# Patient Record
Sex: Male | Born: 1985 | Race: White | Hispanic: No | Marital: Single | Smoking: Never smoker
Health system: Southern US, Community
[De-identification: ages and names within clinical notes are randomized; demographics above are authoritative.]

## PROBLEM LIST (undated history)

## (undated) DIAGNOSIS — F419 Anxiety disorder, unspecified: Secondary | ICD-10-CM

## (undated) HISTORY — PX: KNEE SURGERY: SHX244

## (undated) HISTORY — PX: SHOULDER SURGERY: SHX246

---

## 2013-02-16 DIAGNOSIS — N451 Epididymitis: Secondary | ICD-10-CM | POA: Insufficient documentation

## 2014-03-01 ENCOUNTER — Emergency Department (HOSPITAL_COMMUNITY)
Admission: EM | Admit: 2014-03-01 | Discharge: 2014-03-01 | Disposition: A | Discharge status: Home / Self Care | Payer: No Typology Code available for payment source | Source: Home / Self Care | Attending: Family Medicine | Admitting: Family Medicine

## 2014-03-01 ENCOUNTER — Encounter (HOSPITAL_COMMUNITY): Payer: Self-pay | Admitting: Emergency Medicine

## 2014-03-01 DIAGNOSIS — B354 Tinea corporis: Secondary | ICD-10-CM

## 2014-03-01 MED ORDER — TERBINAFINE HCL 250 MG PO TABS: 250.0000 mg | ORAL_TABLET | Freq: Every day | ORAL | Status: DC

## 2014-03-01 NOTE — ED Provider Notes (Signed)
CSN: [id number]     Arrival date & time 03/01/14  1326 History   First MD Initiated Contact with Patient 03/01/14 1428     Chief Complaint  Patient presents with  . Recurrent Skin Infections   (Consider location/radiation/quality/duration/timing/severity/associated sxs/prior Treatment) HPI Comments: Patient reports that he developed a ringworm lesion at his right forearm about a month ago and developed a similar lesion at left forearm about two weeks ago. Has been using topical Tinactin with little improvement. No fever, chills , malaise, tick bites. Reports himself to be otherwise healthy.  PCP: In [place], [place] Works as IT sales professional.   The history is provided by the patient.    History reviewed. No pertinent past medical history. Past Surgical History  Procedure Laterality Date  . Shoulder surgery    . Knee surgery     No family history on file. History  Substance Use Topics  . Smoking status: Never Smoker   . Smokeless tobacco: Not on file  . Alcohol Use: Yes    Review of Systems  All other systems reviewed and are negative.   Allergies  Penicillins  Home Medications   Prior to Admission medications   Medication Sig Start Date End Date Taking? Authorizing Provider  terbinafine (LAMISIL) 250 MG tablet Take 1 tablet (250 mg total) by mouth daily. X 14 days 03/01/14   Annett Gula H Irem Stoneham, PA   BP 105/71  Pulse 84  Temp(Src) 98.9 F (37.2 C) (Oral)  Resp 16  SpO2 98% Physical Exam  Nursing note and vitals reviewed. Constitutional: He is oriented to person, place, and time. He appears well-developed and well-nourished. No distress.  HENT:  Head: Normocephalic and atraumatic.  Eyes: Conjunctivae are normal.  Cardiovascular: Normal rate.   Pulmonary/Chest: Effort normal.  Musculoskeletal: Normal range of motion.  Neurological: He is alert and oriented to person, place, and time.  Skin: Skin is warm and dry. Rash noted.     Psychiatric: He has a  normal mood and affect. His behavior is normal.    ED Course  Procedures (including critical care time) Labs Review Labs Reviewed - No data to display  Imaging Review No results found.   MDM   1. Ringworm of body    Will prescribe Lamisil 250 po QD x 14 days and advise follow up if no improvement.     Lutricia Feil, [place] 03/01/14 [phone]

## 2014-03-01 NOTE — Discharge Instructions (Signed)

## 2014-03-01 NOTE — ED Notes (Signed)
Bilateral ringworm on both forearms.  Reports he normally can get rid of this with home remedies.  This time not able to do so

## 2014-03-02 NOTE — ED Provider Notes (Signed)
Medical screening examination/treatment/procedure(s) were performed by resident physician or non-physician practitioner and as supervising physician I was immediately available for consultation/collaboration.   Pauline Good MD.   Billy Fischer, MD 03/02/14 [phone]

## 2014-04-15 DIAGNOSIS — S62305A Unspecified fracture of fourth metacarpal bone, left hand, initial encounter for closed fracture: Secondary | ICD-10-CM | POA: Insufficient documentation

## 2016-12-19 DIAGNOSIS — G8929 Other chronic pain: Secondary | ICD-10-CM | POA: Insufficient documentation

## 2016-12-19 DIAGNOSIS — L989 Disorder of the skin and subcutaneous tissue, unspecified: Secondary | ICD-10-CM | POA: Insufficient documentation

## 2016-12-19 DIAGNOSIS — D229 Melanocytic nevi, unspecified: Secondary | ICD-10-CM | POA: Insufficient documentation

## 2016-12-19 DIAGNOSIS — D239 Other benign neoplasm of skin, unspecified: Secondary | ICD-10-CM | POA: Insufficient documentation

## 2019-10-01 ENCOUNTER — Other Ambulatory Visit (HOSPITAL_COMMUNITY): Payer: Self-pay | Admitting: Orthopedic Surgery

## 2019-10-04 ENCOUNTER — Other Ambulatory Visit: Payer: Self-pay

## 2019-10-04 ENCOUNTER — Encounter (HOSPITAL_BASED_OUTPATIENT_CLINIC_OR_DEPARTMENT_OTHER): Payer: Self-pay | Admitting: Orthopedic Surgery

## 2019-10-04 ENCOUNTER — Other Ambulatory Visit (HOSPITAL_COMMUNITY)
Admission: RE | Admit: 2019-10-04 | Discharge: 2019-10-04 | Disposition: A | Discharge status: Home / Self Care | Payer: 59 | Source: Ambulatory Visit | Attending: Orthopedic Surgery | Admitting: Orthopedic Surgery

## 2019-10-04 DIAGNOSIS — Z20822 Contact with and (suspected) exposure to covid-19: Secondary | ICD-10-CM | POA: Insufficient documentation

## 2019-10-04 DIAGNOSIS — Z01812 Encounter for preprocedural laboratory examination: Secondary | ICD-10-CM | POA: Insufficient documentation

## 2019-10-04 LAB — SARS CORONAVIRUS 2 (TAT 6-24 HRS): SARS Coronavirus 2: NEGATIVE

## 2019-10-05 ENCOUNTER — Ambulatory Visit (HOSPITAL_BASED_OUTPATIENT_CLINIC_OR_DEPARTMENT_OTHER)
Admission: RE | Admit: 2019-10-05 | Discharge: 2019-10-05 | Disposition: A | Discharge status: Home / Self Care | Payer: 59 | Attending: Orthopedic Surgery | Admitting: Orthopedic Surgery

## 2019-10-05 ENCOUNTER — Encounter (HOSPITAL_BASED_OUTPATIENT_CLINIC_OR_DEPARTMENT_OTHER)
Admission: RE | Disposition: A | Discharge status: Home / Self Care | Payer: Self-pay | Source: Home / Self Care | Attending: Orthopedic Surgery

## 2019-10-05 ENCOUNTER — Ambulatory Visit (HOSPITAL_COMMUNITY): Payer: 59

## 2019-10-05 ENCOUNTER — Encounter (HOSPITAL_BASED_OUTPATIENT_CLINIC_OR_DEPARTMENT_OTHER): Payer: Self-pay | Admitting: Orthopedic Surgery

## 2019-10-05 ENCOUNTER — Ambulatory Visit (HOSPITAL_BASED_OUTPATIENT_CLINIC_OR_DEPARTMENT_OTHER): Payer: 59 | Admitting: Certified Registered"

## 2019-10-05 ENCOUNTER — Other Ambulatory Visit: Payer: Self-pay

## 2019-10-05 DIAGNOSIS — S82231D Displaced oblique fracture of shaft of right tibia, subsequent encounter for closed fracture with routine healing: Secondary | ICD-10-CM

## 2019-10-05 DIAGNOSIS — S82201A Unspecified fracture of shaft of right tibia, initial encounter for closed fracture: Secondary | ICD-10-CM | POA: Insufficient documentation

## 2019-10-05 DIAGNOSIS — S82891A Other fracture of right lower leg, initial encounter for closed fracture: Secondary | ICD-10-CM | POA: Diagnosis not present

## 2019-10-05 DIAGNOSIS — Y92410 Unspecified street and highway as the place of occurrence of the external cause: Secondary | ICD-10-CM | POA: Insufficient documentation

## 2019-10-05 DIAGNOSIS — Z419 Encounter for procedure for purposes other than remedying health state, unspecified: Secondary | ICD-10-CM

## 2019-10-05 HISTORY — PX: ORIF ANKLE FRACTURE: SHX5408

## 2019-10-05 HISTORY — DX: Anxiety disorder, unspecified: F41.9

## 2019-10-05 HISTORY — PX: TIBIA IM NAIL INSERTION: SHX2516

## 2019-10-05 SURGERY — INSERTION, INTRAMEDULLARY ROD, TIBIA
Anesthesia: General | Site: Leg Lower | Laterality: Right

## 2019-10-05 MED ORDER — OXYCODONE HCL 5 MG PO TABS
ORAL_TABLET | ORAL | Status: AC
  Filled 2019-10-05: qty 2

## 2019-10-05 MED ORDER — ONDANSETRON HCL 4 MG/2ML IJ SOLN
INTRAMUSCULAR | Status: DC | PRN
  Administered 2019-10-05: 4 mg via INTRAVENOUS

## 2019-10-05 MED ORDER — FENTANYL CITRATE (PF) 100 MCG/2ML IJ SOLN
INTRAMUSCULAR | Status: AC
  Filled 2019-10-05: qty 2

## 2019-10-05 MED ORDER — FENTANYL CITRATE (PF) 100 MCG/2ML IJ SOLN
50.0000 ug | INTRAMUSCULAR | Status: DC | PRN
  Administered 2019-10-05: 100 ug via INTRAVENOUS

## 2019-10-05 MED ORDER — OXYCODONE HCL 5 MG/5ML PO SOLN: 5.0000 mg | Freq: Once | ORAL | Status: DC | PRN

## 2019-10-05 MED ORDER — MIDAZOLAM HCL 2 MG/2ML IJ SOLN
INTRAMUSCULAR | Status: AC
  Filled 2019-10-05: qty 2

## 2019-10-05 MED ORDER — DEXAMETHASONE SODIUM PHOSPHATE 10 MG/ML IJ SOLN
INTRAMUSCULAR | Status: DC | PRN
  Administered 2019-10-05: 4 mg via INTRAVENOUS

## 2019-10-05 MED ORDER — KETOROLAC TROMETHAMINE 30 MG/ML IJ SOLN
30.0000 mg | Freq: Once | INTRAMUSCULAR | Status: AC
  Administered 2019-10-05: 30 mg via INTRAVENOUS

## 2019-10-05 MED ORDER — SENNA 8.6 MG PO TABS: 2.0000 | ORAL_TABLET | Freq: Two times a day (BID) | ORAL | 0 refills | Status: DC

## 2019-10-05 MED ORDER — LIDOCAINE HCL (CARDIAC) PF 100 MG/5ML IV SOSY
PREFILLED_SYRINGE | INTRAVENOUS | Status: DC | PRN
  Administered 2019-10-05: 80 mg via INTRAVENOUS

## 2019-10-05 MED ORDER — ACETAMINOPHEN 500 MG PO TABS
1000.0000 mg | ORAL_TABLET | Freq: Once | ORAL | Status: AC
  Administered 2019-10-05: 1000 mg via ORAL

## 2019-10-05 MED ORDER — MIDAZOLAM HCL 2 MG/2ML IJ SOLN
1.0000 mg | INTRAMUSCULAR | Status: DC | PRN
  Administered 2019-10-05: 1 mg via INTRAVENOUS

## 2019-10-05 MED ORDER — OXYCODONE HCL 5 MG PO TABS: 5.0000 mg | ORAL_TABLET | Freq: Once | ORAL | Status: DC | PRN

## 2019-10-05 MED ORDER — FENTANYL CITRATE (PF) 100 MCG/2ML IJ SOLN
INTRAMUSCULAR | Status: DC | PRN
  Administered 2019-10-05: 50 ug via INTRAVENOUS
  Administered 2019-10-05 (×2): 25 ug via INTRAVENOUS

## 2019-10-05 MED ORDER — VANCOMYCIN HCL 500 MG IV SOLR
INTRAVENOUS | Status: DC | PRN
  Administered 2019-10-05: 500 mg via TOPICAL

## 2019-10-05 MED ORDER — ACETAMINOPHEN 500 MG PO TABS
ORAL_TABLET | ORAL | Status: AC
  Filled 2019-10-05: qty 2

## 2019-10-05 MED ORDER — PROPOFOL 10 MG/ML IV BOLUS
INTRAVENOUS | Status: AC
  Filled 2019-10-05: qty 20

## 2019-10-05 MED ORDER — 0.9 % SODIUM CHLORIDE (POUR BTL) OPTIME
TOPICAL | Status: DC | PRN
  Administered 2019-10-05: 1000 mL

## 2019-10-05 MED ORDER — LACTATED RINGERS IV SOLN: INTRAVENOUS | Status: DC

## 2019-10-05 MED ORDER — BUPIVACAINE LIPOSOME 1.3 % IJ SUSP
INTRAMUSCULAR | Status: DC | PRN
  Administered 2019-10-05 (×2): 5 mL via PERINEURAL

## 2019-10-05 MED ORDER — OXYCODONE HCL 5 MG/5ML PO SOLN: 5.0000 mg | Freq: Once | ORAL | Status: AC | PRN

## 2019-10-05 MED ORDER — PHENYLEPHRINE 40 MCG/ML (10ML) SYRINGE FOR IV PUSH (FOR BLOOD PRESSURE SUPPORT)
PREFILLED_SYRINGE | INTRAVENOUS | Status: AC
  Filled 2019-10-05: qty 20

## 2019-10-05 MED ORDER — EPHEDRINE 5 MG/ML INJ
INTRAVENOUS | Status: AC
  Filled 2019-10-05: qty 20

## 2019-10-05 MED ORDER — LIDOCAINE 2% (20 MG/ML) 5 ML SYRINGE
INTRAMUSCULAR | Status: AC
  Filled 2019-10-05: qty 5

## 2019-10-05 MED ORDER — OXYCODONE HCL 5 MG PO TABS: 5.0000 mg | ORAL_TABLET | ORAL | 0 refills | Status: AC | PRN

## 2019-10-05 MED ORDER — PHENYLEPHRINE HCL (PRESSORS) 10 MG/ML IV SOLN
INTRAVENOUS | Status: DC | PRN
  Administered 2019-10-05 (×2): 80 ug via INTRAVENOUS

## 2019-10-05 MED ORDER — BUPIVACAINE HCL (PF) 0.5 % IJ SOLN
INTRAMUSCULAR | Status: AC
  Filled 2019-10-05: qty 30

## 2019-10-05 MED ORDER — CEFAZOLIN SODIUM-DEXTROSE 2-4 GM/100ML-% IV SOLN
INTRAVENOUS | Status: AC
  Filled 2019-10-05: qty 100

## 2019-10-05 MED ORDER — PROPOFOL 10 MG/ML IV BOLUS
INTRAVENOUS | Status: DC | PRN
  Administered 2019-10-05: 50 mg via INTRAVENOUS
  Administered 2019-10-05: 150 mg via INTRAVENOUS

## 2019-10-05 MED ORDER — BUPIVACAINE-EPINEPHRINE (PF) 0.5% -1:200000 IJ SOLN
INTRAMUSCULAR | Status: DC | PRN
  Administered 2019-10-05: 7 mL via PERINEURAL
  Administered 2019-10-05: 15 mL via PERINEURAL

## 2019-10-05 MED ORDER — VANCOMYCIN HCL 500 MG IV SOLR
INTRAVENOUS | Status: AC
  Filled 2019-10-05: qty 500

## 2019-10-05 MED ORDER — DOCUSATE SODIUM 100 MG PO CAPS: 100.0000 mg | ORAL_CAPSULE | Freq: Every day | ORAL | 2 refills | Status: DC | PRN

## 2019-10-05 MED ORDER — PROMETHAZINE HCL 25 MG/ML IJ SOLN: 6.2500 mg | INTRAMUSCULAR | Status: DC | PRN

## 2019-10-05 MED ORDER — CEFAZOLIN SODIUM-DEXTROSE 2-4 GM/100ML-% IV SOLN
2.0000 g | INTRAVENOUS | Status: AC
  Administered 2019-10-05: 15:00:00 2 g via INTRAVENOUS

## 2019-10-05 MED ORDER — KETOROLAC TROMETHAMINE 30 MG/ML IJ SOLN
INTRAMUSCULAR | Status: AC
  Filled 2019-10-05: qty 1

## 2019-10-05 MED ORDER — EPHEDRINE SULFATE 50 MG/ML IJ SOLN
INTRAMUSCULAR | Status: DC | PRN
  Administered 2019-10-05: 5 mg via INTRAVENOUS

## 2019-10-05 MED ORDER — FENTANYL CITRATE (PF) 100 MCG/2ML IJ SOLN
25.0000 ug | INTRAMUSCULAR | Status: DC | PRN
  Administered 2019-10-05 (×2): 50 ug via INTRAVENOUS

## 2019-10-05 MED ORDER — OXYCODONE HCL 5 MG PO TABS
10.0000 mg | ORAL_TABLET | Freq: Once | ORAL | Status: AC | PRN
  Administered 2019-10-05: 10 mg via ORAL

## 2019-10-05 MED ORDER — SODIUM CHLORIDE 0.9 % IV SOLN: INTRAVENOUS | Status: DC

## 2019-10-05 SURGICAL SUPPLY — 82 items
BANDAGE ESMARK 6X9 LF (GAUZE/BANDAGES/DRESSINGS) ×2 IMPLANT
BIT DRILL 2.9 CANN QC NONSTRL (BIT) ×2 IMPLANT
BIT DRILL 3.8X6 NS (BIT) ×2 IMPLANT
BIT DRILL 4.4 NS (BIT) ×2 IMPLANT
BLADE SURG 15 STRL LF DISP TIS (BLADE) ×2 IMPLANT
BLADE SURG 15 STRL SS (BLADE) ×4
BNDG COHESIVE 4X5 TAN STRL (GAUZE/BANDAGES/DRESSINGS) IMPLANT
BNDG COHESIVE 6X5 TAN STRL LF (GAUZE/BANDAGES/DRESSINGS) IMPLANT
BNDG ELASTIC 4X5.8 VLCR STR LF (GAUZE/BANDAGES/DRESSINGS) IMPLANT
BNDG ELASTIC 6X5.8 VLCR STR LF (GAUZE/BANDAGES/DRESSINGS) IMPLANT
BNDG ESMARK 6X9 LF (GAUZE/BANDAGES/DRESSINGS)
BOOT STEPPER DURA XLG (SOFTGOODS) ×2 IMPLANT
CANISTER SUCT 1200ML W/VALVE (MISCELLANEOUS) ×4 IMPLANT
CHLORAPREP W/TINT 26 (MISCELLANEOUS) ×4 IMPLANT
CLOSURE WOUND 1/2 X4 (GAUZE/BANDAGES/DRESSINGS)
COVER BACK TABLE 60X90IN (DRAPES) ×8 IMPLANT
COVER MAYO STAND STRL (DRAPES) ×4 IMPLANT
COVER WAND RF STERILE (DRAPES) IMPLANT
CUFF TOURN SGL QUICK 34 (TOURNIQUET CUFF)
CUFF TRNQT CYL 34X4.125X (TOURNIQUET CUFF) IMPLANT
DRAPE C-ARM 42X72 X-RAY (DRAPES) ×4 IMPLANT
DRAPE C-ARMOR (DRAPES) ×4 IMPLANT
DRAPE EXTREMITY T 121X128X90 (DISPOSABLE) ×4 IMPLANT
DRAPE U-SHAPE 47X51 STRL (DRAPES) ×4 IMPLANT
DRSG MEPITEL 4X7.2 (GAUZE/BANDAGES/DRESSINGS) ×4 IMPLANT
DRSG PAD ABDOMINAL 8X10 ST (GAUZE/BANDAGES/DRESSINGS) ×8 IMPLANT
ELECT REM PT RETURN 9FT ADLT (ELECTROSURGICAL) ×4
ELECTRODE REM PT RTRN 9FT ADLT (ELECTROSURGICAL) ×2 IMPLANT
GAUZE SPONGE 4X4 12PLY STRL (GAUZE/BANDAGES/DRESSINGS) ×4 IMPLANT
GLOVE BIO SURGEON STRL SZ8 (GLOVE) ×4 IMPLANT
GLOVE BIOGEL PI IND STRL 7.5 (GLOVE) IMPLANT
GLOVE BIOGEL PI IND STRL 8 (GLOVE) ×4 IMPLANT
GLOVE BIOGEL PI INDICATOR 7.5 (GLOVE) ×2
GLOVE BIOGEL PI INDICATOR 8 (GLOVE) ×4
GLOVE ECLIPSE 8.0 STRL XLNG CF (GLOVE) ×4 IMPLANT
GLOVE SURG SS PI 7.5 STRL IVOR (GLOVE) ×2 IMPLANT
GOWN STRL REUS W/ TWL LRG LVL3 (GOWN DISPOSABLE) ×2 IMPLANT
GOWN STRL REUS W/ TWL XL LVL3 (GOWN DISPOSABLE) ×4 IMPLANT
GOWN STRL REUS W/TWL LRG LVL3 (GOWN DISPOSABLE)
GOWN STRL REUS W/TWL XL LVL3 (GOWN DISPOSABLE) ×6
GUIDEPIN 3.2X17.5 THRD DISP (PIN) ×2 IMPLANT
GUIDEWIRE BALL NOSE 80CM (WIRE) ×2 IMPLANT
K-WIRE ACE 1.6X6 (WIRE) ×4
KWIRE ACE 1.6X6 (WIRE) IMPLANT
NAIL TIBIA 9X40.5 (Nail) ×2 IMPLANT
NEEDLE HYPO 22GX1.5 SAFETY (NEEDLE) IMPLANT
NS IRRIG 1000ML POUR BTL (IV SOLUTION) ×4 IMPLANT
PACK BASIN DAY SURGERY FS (CUSTOM PROCEDURE TRAY) ×4 IMPLANT
PAD CAST 4YDX4 CTTN HI CHSV (CAST SUPPLIES) ×2 IMPLANT
PADDING CAST COTTON 4X4 STRL (CAST SUPPLIES) ×2
PADDING CAST COTTON 6X4 STRL (CAST SUPPLIES) ×4 IMPLANT
PENCIL SMOKE EVACUATOR (MISCELLANEOUS) ×4 IMPLANT
SANITIZER HAND PURELL 535ML FO (MISCELLANEOUS) ×4 IMPLANT
SCREW ACE CAN 4.0 46M (Screw) ×2 IMPLANT
SCREW ACECAP 38MM (Screw) ×2 IMPLANT
SCREW ACECAP 46MM (Screw) ×2 IMPLANT
SCREW PROXIMAL DEPUY (Screw) ×4 IMPLANT
SCREW PRXML FT 45X5.5XLCK NS (Screw) IMPLANT
SCREW PRXML FT 65X5.5XNS CORT (Screw) IMPLANT
SHEET MEDIUM DRAPE 40X70 STRL (DRAPES) ×4 IMPLANT
SLEEVE SCD COMPRESS KNEE MED (MISCELLANEOUS) ×4 IMPLANT
SPLINT FAST PLASTER 5X30 (CAST SUPPLIES)
SPLINT PLASTER CAST FAST 5X30 (CAST SUPPLIES) IMPLANT
SPONGE LAP 18X18 RF (DISPOSABLE) ×4 IMPLANT
STOCKINETTE 6  STRL (DRAPES) ×2
STOCKINETTE 6 STRL (DRAPES) ×2 IMPLANT
STRIP CLOSURE SKIN 1/2X4 (GAUZE/BANDAGES/DRESSINGS) IMPLANT
SUCTION FRAZIER HANDLE 10FR (MISCELLANEOUS) ×2
SUCTION TUBE FRAZIER 10FR DISP (MISCELLANEOUS) ×2 IMPLANT
SUT ETHILON 2 0 FS 18 (SUTURE) IMPLANT
SUT ETHILON 3 0 PS 1 (SUTURE) ×4 IMPLANT
SUT MNCRL AB 3-0 PS2 18 (SUTURE) ×4 IMPLANT
SUT VIC AB 0 SH 27 (SUTURE) ×4 IMPLANT
SUT VIC AB 2-0 SH 27 (SUTURE)
SUT VIC AB 2-0 SH 27XBRD (SUTURE) IMPLANT
SYR BULB 3OZ (MISCELLANEOUS) ×4 IMPLANT
SYR CONTROL 10ML LL (SYRINGE) IMPLANT
TOWEL GREEN STERILE FF (TOWEL DISPOSABLE) ×8 IMPLANT
TUBE CONNECTING 20'X1/4 (TUBING) ×1
TUBE CONNECTING 20X1/4 (TUBING) ×3 IMPLANT
UNDERPAD 30X36 HEAVY ABSORB (UNDERPADS AND DIAPERS) ×4 IMPLANT
YANKAUER SUCT BULB TIP NO VENT (SUCTIONS) ×4 IMPLANT

## 2019-10-05 NOTE — Anesthesia Preprocedure Evaluation (Addendum)
Anesthesia Evaluation  Patient identified by MRN, date of birth, ID band Patient awake    Reviewed: Allergy & Precautions, NPO status , Patient's Chart, lab work & pertinent test results  History of Anesthesia Complications Negative for: history of anesthetic complications  Airway Mallampati: II  TM Distance: >3 FB Neck ROM: Full    Dental no notable dental hx.    Pulmonary neg pulmonary ROS,    Pulmonary exam normal        Cardiovascular negative cardio ROS Normal cardiovascular exam     Neuro/Psych Anxiety negative neurological ROS     GI/Hepatic negative GI ROS, Neg liver ROS,   Endo/Other  negative endocrine ROS  Renal/GU negative Renal ROS  negative genitourinary   Musculoskeletal displaced transverse right tibia fracture   Abdominal   Peds  Hematology negative hematology ROS (+)   Anesthesia Other Findings Day of surgery medications reviewed with patient.  Reproductive/Obstetrics negative OB ROS                            Anesthesia Physical Anesthesia Plan  ASA: I  Anesthesia Plan: General   Post-op Pain Management: GA combined w/ Regional for post-op pain   Induction: Intravenous  PONV Risk Score and Plan: 2 and Treatment may vary due to age or medical condition, Ondansetron, Dexamethasone and Midazolam  Airway Management Planned: LMA  Additional Equipment: None  Intra-op Plan:   Post-operative Plan: Extubation in OR  Informed Consent: I have reviewed the patients History and Physical, chart, labs and discussed the procedure including the risks, benefits and alternatives for the proposed anesthesia with the patient or authorized representative who has indicated his/her understanding and acceptance.     Dental advisory given  Plan Discussed with: CRNA  Anesthesia Plan Comments:        Anesthesia Quick Evaluation

## 2019-10-05 NOTE — Anesthesia Procedure Notes (Signed)
Procedure Name: LMA Insertion Date/Time: 10/05/2019 2:41 PM Performed by: Lavonia Dana, CRNA Pre-anesthesia Checklist: Patient identified, Emergency Drugs available, Suction available and Patient being monitored Patient Re-evaluated:Patient Re-evaluated prior to induction Oxygen Delivery Method: Circle system utilized Preoxygenation: Pre-oxygenation with 100% oxygen Induction Type: IV induction Ventilation: Mask ventilation without difficulty LMA: LMA inserted LMA Size: 5.0 Number of attempts: 1 Airway Equipment and Method: Bite block Placement Confirmation: positive ETCO2 Tube secured with: Tape Dental Injury: Teeth and Oropharynx as per pre-operative assessment

## 2019-10-05 NOTE — Progress Notes (Signed)
Discussed Pencillin allergy with Dr Daiva Huge.  OK to proceed with Ancef antibiotic for case that is ordered
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
## 2019-10-05 NOTE — Anesthesia Procedure Notes (Signed)
Anesthesia Regional Block: Popliteal block   Pre-Anesthetic Checklist: ,, timeout performed, Correct Patient, Correct Site, Correct Laterality, Correct Procedure, Correct Position, site marked, Risks and benefits discussed, pre-op evaluation,  At surgeon's request and post-op pain management  Laterality: Right  Prep: Maximum Sterile Barrier Precautions used, chloraprep       Needles:  Injection technique: Single-shot  Needle Type: Echogenic Stimulator Needle     Needle Length: 9cm  Needle Gauge: 22     Additional Needles:   Procedures:,,,, ultrasound used (permanent image in chart),,,,  Narrative:  Start time: 10/05/2019 1:34 PM End time: 10/05/2019 1:36 PM Injection made incrementally with aspirations every 5 mL.  Performed by: Personally  Anesthesiologist: Brennan Bailey, MD  Additional Notes: Risks, benefits, and alternative discussed. Patient gave consent for procedure. Patient prepped and draped in sterile fashion. Sedation administered, patient remains easily responsive to voice. Relevant anatomy identified with ultrasound guidance. Local anesthetic given in 5cc increments with no signs or symptoms of intravascular injection. No pain or paraesthesias with injection. Patient monitored throughout procedure with signs of LAST or immediate complications. Tolerated well. Ultrasound image placed in chart.  Tawny Asal, MD

## 2019-10-05 NOTE — Discharge Instructions (Addendum)
Post Anesthesia Home Care Instructions  Activity: Get plenty of rest for the remainder of the day. A responsible individual must stay with you for 24 hours following the procedure.  For the next 24 hours, DO NOT: -Drive a car -Paediatric nurse -Drink alcoholic beverages -Take any medication unless instructed by your physician -Make any legal decisions or sign important papers.  Meals: Start with liquid foods such as gelatin or soup. Progress to regular foods as tolerated. Avoid greasy, spicy, heavy foods. If nausea and/or vomiting occur, drink only clear liquids until the nausea and/or vomiting subsides. Call your physician if vomiting continues.  Special Instructions/Symptoms: Your throat may feel dry or sore from the anesthesia or the breathing tube placed in your throat during surgery. If this causes discomfort, gargle with warm salt water. The discomfort should disappear within 24 hours.  If you had a scopolamine patch placed behind your ear for the management of post- operative nausea and/or vomiting:  1. The medication in the patch is effective for 72 hours, after which it should be removed.  Wrap patch in a tissue and discard in the trash. Wash hands thoroughly with soap and water. 2. You may remove the patch earlier than 72 hours if you experience unpleasant side effects which may include dry mouth, dizziness or visual disturbances. 3. Avoid touching the patch. Wash your hands with soap and water after contact with the patch.    Regional Anesthesia Blocks  1. Numbness or the inability to move the "blocked" extremity may last from 3-48 hours after placement. The length of time depends on the medication injected and your individual response to the medication. If the numbness is not going away after 48 hours, call your surgeon.  2. The extremity that is blocked will need to be protected until the numbness is gone and the  Strength has returned. Because you cannot feel it, you will  need to take extra care to avoid injury. Because it may be weak, you may have difficulty moving it or using it. You may not know what position it is in without looking at it while the block is in effect.  3. For blocks in the legs and feet, returning to weight bearing and walking needs to be done carefully. You will need to wait until the numbness is entirely gone and the strength has returned. You should be able to move your leg and foot normally before you try and bear weight or walk. You will need someone to be with you when you first try to ensure you do not fall and possibly risk injury.  4. Bruising and tenderness at the needle site are common side effects and will resolve in a few days.  5. Persistent numbness or new problems with movement should be communicated to the surgeon or the [place] [facility] [phone] [place] [phone]).Information for Discharge Teaching: EXPAREL (bupivacaine liposome injectable suspension)   Your surgeon or anesthesiologist gave you EXPAREL(bupivacaine) to help control your pain after surgery.   EXPAREL is a local anesthetic that provides pain relief by numbing the tissue around the surgical site.  EXPAREL is designed to release pain medication over time and can control pain for up to 72 hours.  Depending on how you respond to EXPAREL, you may require less pain medication during your recovery.  Possible side effects:  Temporary loss of sensation or ability to move in the area where bupivacaine was injected.  Nausea, vomiting, constipation  Rarely, numbness and tingling in  your mouth or lips, lightheadedness, or anxiety may occur.  Call your doctor right away if you think you may be experiencing any of these sensations, or if you have other questions regarding possible side effects.  Follow all other discharge instructions given to you by your surgeon or nurse. Eat a healthy diet and drink plenty of water or other  fluids.  If you return to the hospital for any reason within 96 hours following the administration of EXPAREL, it is important for health care providers to know that you have received this anesthetic. A teal colored band has been placed on your arm with the date, time and amount of EXPAREL you have received in order to alert and inform your health care providers. Please leave this armband in place for the full 96 hours following administration, and then you may remove the band.Wylene Simmer, MD [facility]  Please read the following information regarding your care after surgery.  Medications  You only need a prescription for the narcotic pain medicine (ex. oxycodone, Percocet, Norco).  All of the other medicines listed below are available over the counter. X Ibuprofen 800 mg that you have at home as prescribed for the first 3 days after surgery. X acetominophen (Tylenol) 650 mg every 4-6 hours as you need for minor to moderate pain X oxycodone as prescribed for severe pain  Narcotic pain medicine (ex. oxycodone, Percocet, Vicodin) will cause constipation.  To prevent this problem, take the following medicines while you are taking any pain medicine. X docusate sodium (Colace) 100 mg twice a day X senna (Senokot) 2 tablets twice a day  Weight Bearing X Bear weight only on your operated foot in the CAM boot.   Cast / Splint / Dressing X Keep your splint, cast or dressing clean and dry.  Don't put anything (coat hanger, pencil, etc) down inside of it.  If it gets damp, use a hair dryer on the cool setting to dry it.  If it gets soaked, call the office to schedule an appointment for a cast change.   After your dressing, cast or splint is removed; you may shower, but do not soak or scrub the wound.  Allow the water to run over it, and then gently pat it dry.  Swelling It is normal for you to have swelling where you had surgery.  To reduce swelling and pain, keep your toes above your nose for at  least 3 days after surgery.  It may be necessary to keep your foot or leg elevated for several weeks.  If it hurts, it should be elevated.  Follow Up Call my office at [phone] when you are discharged from the hospital or surgery center to schedule an appointment to be seen two weeks after surgery.  Call my office at [phone] if you develop a fever >101.5 F, nausea, vomiting, bleeding from the surgical site or severe pain.
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
## 2019-10-05 NOTE — Anesthesia Procedure Notes (Signed)
Anesthesia Regional Block: Adductor canal block   Pre-Anesthetic Checklist: ,, timeout performed, Correct Patient, Correct Site, Correct Laterality, Correct Procedure, Correct Position, site marked, Risks and benefits discussed, pre-op evaluation,  At surgeon's request and post-op pain management  Laterality: Right  Prep: Maximum Sterile Barrier Precautions used, chloraprep       Needles:  Injection technique: Single-shot  Needle Type: Echogenic Stimulator Needle     Needle Length: 9cm  Needle Gauge: 22     Additional Needles:   Procedures:,,,, ultrasound used (permanent image in chart),,,,  Narrative:  Start time: 10/05/2019 1:30 PM End time: 10/05/2019 1:33 PM Injection made incrementally with aspirations every 5 mL.  Performed by: Personally  Anesthesiologist: Brennan Bailey, MD  Additional Notes: Risks, benefits, and alternative discussed. Patient gave consent for procedure. Patient prepped and draped in sterile fashion. Sedation administered, patient remains easily responsive to voice. Relevant anatomy identified with ultrasound guidance. Local anesthetic given in 5cc increments with no signs or symptoms of intravascular injection. No pain or paraesthesias with injection. Patient monitored throughout procedure with signs of LAST or immediate complications. Tolerated well. Ultrasound image placed in chart.  Tawny Asal, MD

## 2019-10-05 NOTE — Progress Notes (Signed)
Assisted Dr. Howze with right, ultrasound guided, popliteal/saphenous, adductor canal block. Side rails up, monitors on throughout procedure. See vital signs in flow sheet. Tolerated Procedure well. 

## 2019-10-05 NOTE — Transfer of Care (Signed)
Immediate Anesthesia Transfer of Care Note  Patient: Derrick Dixon  Procedure(s) Performed: Intramedullary nailing right tibia;  open reduction internal fixation right ankle posterior malleolus fracture (Right Leg Lower) open reduction internal fixation medial malleolus fracture (Ankle)  Patient Location: PACU  Anesthesia Type:GA combined with regional for post-op pain  Level of Consciousness: awake, alert  and oriented  Airway & Oxygen Therapy: Patient Spontanous Breathing and Patient connected to face mask oxygen  Post-op Assessment: Report given to RN and Post -op Vital signs reviewed and stable  Post vital signs: Reviewed and stable  Last Vitals:  Vitals Value Taken Time  BP 133/78 10/05/19 1616  Temp    Pulse 95 10/05/19 1627  Resp 12 10/05/19 1627  SpO2 100 % 10/05/19 1627  Vitals shown include unvalidated device data.  Last Pain:  Vitals:   10/05/19 1326  TempSrc:   PainSc: 8          Complications: No apparent anesthesia complications

## 2019-10-06 NOTE — Anesthesia Postprocedure Evaluation (Signed)
Anesthesia Post Note  Patient: Derrick Dixon  Procedure(s) Performed: Intramedullary nailing right tibia;  open reduction internal fixation right ankle posterior malleolus fracture (Right Leg Lower) open reduction internal fixation medial malleolus fracture (Ankle)     Patient location during evaluation: PACU Anesthesia Type: General Level of consciousness: awake and alert and oriented Pain management: pain level controlled Vital Signs Assessment: post-procedure vital signs reviewed and stable Respiratory status: spontaneous breathing, nonlabored ventilation and respiratory function stable Cardiovascular status: blood pressure returned to baseline Postop Assessment: no apparent nausea or vomiting Anesthetic complications: no    Last Vitals:  Vitals:   10/05/19 1700 10/05/19 1715  BP: 138/90 (!) 137/95  Pulse: (!) 101 99  Resp: 19 18  Temp:  37.2 C  SpO2: 98% 98%    Last Pain:  Vitals:   10/05/19 1715  TempSrc:   PainSc: [place]

## 2019-10-12 ENCOUNTER — Encounter: Payer: Self-pay | Admitting: *Deleted

## 2020-01-11 ENCOUNTER — Ambulatory Visit (INDEPENDENT_AMBULATORY_CARE_PROVIDER_SITE_OTHER): Payer: 59 | Admitting: Family Medicine

## 2020-01-11 ENCOUNTER — Other Ambulatory Visit: Payer: Self-pay

## 2020-01-11 ENCOUNTER — Encounter: Payer: Self-pay | Admitting: Family Medicine

## 2020-01-11 VITALS — BP 108/64 | HR 85 | Temp 98.3°F | Ht 75.0 in | Wt 165.8 lb

## 2020-01-11 DIAGNOSIS — F341 Dysthymic disorder: Secondary | ICD-10-CM | POA: Diagnosis not present

## 2020-01-11 DIAGNOSIS — R0681 Apnea, not elsewhere classified: Secondary | ICD-10-CM

## 2020-01-11 DIAGNOSIS — Z0000 Encounter for general adult medical examination without abnormal findings: Secondary | ICD-10-CM

## 2020-01-11 NOTE — Patient Instructions (Signed)
Health Maintenance, Male Adopting a healthy lifestyle and getting preventive care are important in promoting health and wellness. Ask your health care provider about:  The right schedule for you to have regular tests and exams.  Things you can do on your own to prevent diseases and keep yourself healthy. What should I know about diet, weight, and exercise? Eat a healthy diet   Eat a diet that includes plenty of vegetables, fruits, low-fat dairy products, and lean protein.  Do not eat a lot of foods that are high in solid fats, added sugars, or sodium. Maintain a healthy weight Body mass index (BMI) is a measurement that can be used to identify possible weight problems. It estimates body fat based on height and weight. Your health care provider can help determine your BMI and help you achieve or maintain a healthy weight. Get regular exercise Get regular exercise. This is one of the most important things you can do for your health. Most adults should:  Exercise for at least 150 minutes each week. The exercise should increase your heart rate and make you sweat (moderate-intensity exercise).  Do strengthening exercises at least twice a week. This is in addition to the moderate-intensity exercise.  Spend less time sitting. Even light physical activity can be beneficial. Watch cholesterol and blood lipids Have your blood tested for lipids and cholesterol at 32 years of age, then have this test every 5 years. You may need to have your cholesterol levels checked more often if:  Your lipid or cholesterol levels are high.  You are older than 32 years of age.  You are at high risk for heart disease. What should I know about cancer screening? Many types of cancers can be detected early and may often be prevented. Depending on your health history and family history, you may need to have cancer screening at various ages. This may include screening for:  Colorectal cancer.  Prostate  cancer.  Skin cancer.  Lung cancer. What should I know about heart disease, diabetes, and high blood pressure? Blood pressure and heart disease  High blood pressure causes heart disease and increases the risk of stroke. This is more likely to develop in people who have high blood pressure readings, are of African descent, or are overweight.  Talk with your health care provider about your target blood pressure readings.  Have your blood pressure checked: ? Every 3-5 years if you are 32-59 years of age. ? Every year if you are 32 years old or older.  If you are between the ages of 32 and 56 and are a current or former smoker, ask your health care provider if you should have a one-time screening for abdominal aortic aneurysm (AAA). Diabetes Have regular diabetes screenings. This checks your fasting blood sugar level. Have the screening done:  Once every three years after age 32 if you are at a normal weight and have a low risk for diabetes.  More often and at a younger age if you are overweight or have a high risk for diabetes. What should I know about preventing infection? Hepatitis B If you have a higher risk for hepatitis B, you should be screened for this virus. Talk with your health care provider to find out if you are at risk for hepatitis B infection. Hepatitis C Blood testing is recommended for:  Everyone born from [birth year] through 1963.  Anyone with known risk factors for hepatitis C. Sexually transmitted infections (STIs)  You should be screened each year  for STIs, including gonorrhea and chlamydia, if: ? You are sexually active and are younger than 32 years of age. ? You are older than 32 years of age and your health care provider tells you that you are at risk for this type of infection. ? Your sexual activity has changed since you were last screened, and you are at increased risk for chlamydia or gonorrhea. Ask your health care provider if you are at risk.  Ask your  health care provider about whether you are at high risk for HIV. Your health care provider may recommend a prescription medicine to help prevent HIV infection. If you choose to take medicine to prevent HIV, you should first get tested for HIV. You should then be tested every 3 months for as long as you are taking the medicine. Follow these instructions at home: Lifestyle  Do not use any products that contain nicotine or tobacco, such as cigarettes, e-cigarettes, and chewing tobacco. If you need help quitting, ask your health care provider.  Do not use street drugs.  Do not share needles.  Ask your health care provider for help if you need support or information about quitting drugs. Alcohol use  Do not drink alcohol if your health care provider tells you not to drink.  If you drink alcohol: ? Limit how much you have to 0-2 drinks a day. ? Be aware of how much alcohol is in your drink. In the [place] one drink equals one 12 oz bottle of beer (355 mL), one 5 oz glass of wine (148 mL), or one 1 oz glass of hard liquor (44 mL). General instructions  Schedule regular health, dental, and eye exams.  Stay current with your vaccines.  Tell your health care provider if: ? You often feel depressed. ? You have ever been abused or do not feel safe at home. Summary  Adopting a healthy lifestyle and getting preventive care are important in promoting health and wellness.  Follow your health care provider's instructions about healthy diet, exercising, and getting tested or screened for diseases.  Follow your health care provider's instructions on monitoring your cholesterol and blood pressure. This information is not intended to replace advice given to you by your health care provider. Make sure you discuss any questions you have with your health care provider. Document Revised: 06/03/2018 Document Reviewed: 06/03/2018 Elsevier Patient Education  2020 [employer]  Preventive Care 21-39 Years  Old, Male Preventive care refers to lifestyle choices and visits with your health care provider that can promote health and wellness. This includes:  A yearly physical exam. This is also called an annual well check.  Regular dental and eye exams.  Immunizations.  Screening for certain conditions.  Healthy lifestyle choices, such as eating a healthy diet, getting regular exercise, not using drugs or products that contain nicotine and tobacco, and limiting alcohol use. What can I expect for my preventive care visit? Physical exam Your health care provider will check:  Height and weight. These may be used to calculate body mass index (BMI), which is a measurement that tells if you are at a healthy weight.  Heart rate and blood pressure.  Your skin for abnormal spots. Counseling Your health care provider may ask you questions about:  Alcohol, tobacco, and drug use.  Emotional well-being.  Home and relationship well-being.  Sexual activity.  Eating habits.  Work and work environment. What immunizations do I need?  Influenza (flu) vaccine  This is recommended every year. Tetanus, diphtheria,   and pertussis (Tdap) vaccine  You may need a Td booster every 10 years. Varicella (chickenpox) vaccine  You may need this vaccine if you have not already been vaccinated. Human papillomavirus (HPV) vaccine  If recommended by your health care provider, you may need three doses over 6 months. Measles, mumps, and rubella (MMR) vaccine  You may need at least one dose of MMR. You may also need a second dose. Meningococcal conjugate (MenACWY) vaccine  One dose is recommended if you are 73-28 years old and a Market researcher living in a residence hall, or if you have one of several medical conditions. You may also need additional booster doses. Pneumococcal conjugate (PCV13) vaccine  You may need this if you have certain conditions and were not previously  vaccinated. Pneumococcal polysaccharide (PPSV23) vaccine  You may need one or two doses if you smoke cigarettes or if you have certain conditions. Hepatitis A vaccine  You may need this if you have certain conditions or if you travel or work in places where you may be exposed to hepatitis A. Hepatitis B vaccine  You may need this if you have certain conditions or if you travel or work in places where you may be exposed to hepatitis B. Haemophilus influenzae type b (Hib) vaccine  You may need this if you have certain risk factors. You may receive vaccines as individual doses or as more than one vaccine together in one shot (combination vaccines). Talk with your health care provider about the risks and benefits of combination vaccines. What tests do I need? Blood tests  Lipid and cholesterol levels. These may be checked every 5 years starting at age 80.  Hepatitis C test.  Hepatitis B test. Screening   Diabetes screening. This is done by checking your blood sugar (glucose) after you have not eaten for a while (fasting).  Sexually transmitted disease (STD) testing. Talk with your health care provider about your test results, treatment options, and if necessary, the need for more tests. Follow these instructions at home: Eating and drinking   Eat a diet that includes fresh fruits and vegetables, whole grains, lean protein, and low-fat dairy products.  Take vitamin and mineral supplements as recommended by your health care provider.  Do not drink alcohol if your health care provider tells you not to drink.  If you drink alcohol: ? Limit how much you have to 0-2 drinks a day. ? Be aware of how much alcohol is in your drink. In the [place] one drink equals one 12 oz bottle of beer (355 mL), one 5 oz glass of wine (148 mL), or one 1 oz glass of hard liquor (44 mL). Lifestyle  Take daily care of your teeth and gums.  Stay active. Exercise for at least 30 minutes on 5 or more days  each week.  Do not use any products that contain nicotine or tobacco, such as cigarettes, e-cigarettes, and chewing tobacco. If you need help quitting, ask your health care provider.  If you are sexually active, practice safe sex. Use a condom or other form of protection to prevent STIs (sexually transmitted infections). What's next?  Go to your health care provider once a year for a well check visit.  Ask your health care provider how often you should have your eyes and teeth checked.  Stay up to date on all vaccines. This information is not intended to replace advice given to you by your health care provider. Make sure you discuss any questions you  have with your health care provider. Document Revised: 06/04/2018 Document Reviewed: 06/04/2018 Elsevier Patient Education  [place].  Sleep Apnea Sleep apnea is a condition in which breathing pauses or becomes shallow during sleep. Episodes of sleep apnea usually last 10 seconds or longer, and they may occur as many as 20 times an hour. Sleep apnea disrupts your sleep and keeps your body from getting the rest that it needs. This condition can increase your risk of certain health problems, including:  Heart attack.  Stroke.  Obesity.  Diabetes.  Heart failure.  Irregular heartbeat. What are the causes? There are three kinds of sleep apnea:  Obstructive sleep apnea. This kind is caused by a blocked or collapsed airway.  Central sleep apnea. This kind happens when the part of the brain that controls breathing does not send the correct signals to the muscles that control breathing.  Mixed sleep apnea. This is a combination of obstructive and central sleep apnea. The most common cause of this condition is a collapsed or blocked airway. An airway can collapse or become blocked if:  Your throat muscles are abnormally relaxed.  Your tongue and tonsils are larger than normal.  You are overweight.  Your airway is smaller  than normal. What increases the risk? You are more likely to develop this condition if you:  Are overweight.  Smoke.  Have a smaller than normal airway.  Are elderly.  Are male.  Drink alcohol.  Take sedatives or tranquilizers.  Have a family history of sleep apnea. What are the signs or symptoms? Symptoms of this condition include:  Trouble staying asleep.  Daytime sleepiness and tiredness.  Irritability.  Loud snoring.  Morning headaches.  Trouble concentrating.  Forgetfulness.  Decreased interest in sex.  Unexplained sleepiness.  Mood swings.  Personality changes.  Feelings of depression.  Waking up often during the night to urinate.  Dry mouth.  Sore throat. How is this diagnosed? This condition may be diagnosed with:  A medical history.  A physical exam.  A series of tests that are done while you are sleeping (sleep study). These tests are usually done in a sleep lab, but they may also be done at home. How is this treated? Treatment for this condition aims to restore normal breathing and to ease symptoms during sleep. It may involve managing health issues that can affect breathing, such as high blood pressure or obesity. Treatment may include:  Sleeping on your side.  Using a decongestant if you have nasal congestion.  Avoiding the use of depressants, including alcohol, sedatives, and narcotics.  Losing weight if you are overweight.  Making changes to your diet.  Quitting smoking.  Using a device to open your airway while you sleep, such as: ? An oral appliance. This is a custom-made mouthpiece that shifts your lower jaw forward. ? A continuous positive airway pressure (CPAP) device. This device blows air through a mask when you breathe out (exhale). ? A nasal expiratory positive airway pressure (EPAP) device. This device has valves that you put into each nostril. ? A bi-level positive airway pressure (BPAP) device. This device blows  air through a mask when you breathe in (inhale) and breathe out (exhale).  Having surgery if other treatments do not work. During surgery, excess tissue is removed to create a wider airway. It is important to get treatment for sleep apnea. Without treatment, this condition can lead to:  High blood pressure.  Coronary artery disease.  In men, an inability to  achieve or maintain an erection (impotence).  Reduced thinking abilities. Follow these instructions at home: Lifestyle  Make any lifestyle changes that your health care provider recommends.  Eat a healthy, well-balanced diet.  Take steps to lose weight if you are overweight.  Avoid using depressants, including alcohol, sedatives, and narcotics.  Do not use any products that contain nicotine or tobacco, such as cigarettes, e-cigarettes, and chewing tobacco. If you need help quitting, ask your health care provider. General instructions  Take over-the-counter and prescription medicines only as told by your health care provider.  If you were given a device to open your airway while you sleep, use it only as told by your health care provider.  If you are having surgery, make sure to tell your health care provider you have sleep apnea. You may need to bring your device with you.  Keep all follow-up visits as told by your health care provider. This is important. Contact a health care provider if:  The device that you received to open your airway during sleep is uncomfortable or does not seem to be working.  Your symptoms do not improve.  Your symptoms get worse. Get help right away if:  You develop: ? Chest pain. ? Shortness of breath. ? Discomfort in your back, arms, or stomach.  You have: ? Trouble speaking. ? Weakness on one side of your body. ? Drooping in your face. These symptoms may represent a serious problem that is an emergency. Do not wait to see if the symptoms will go away. Get medical help right away. Call  your local emergency services (911 in the [place] Do not drive yourself to the hospital. Summary  Sleep apnea is a condition in which breathing pauses or becomes shallow during sleep.  The most common cause is a collapsed or blocked airway.  The goal of treatment is to restore normal breathing and to ease symptoms during sleep. This information is not intended to replace advice given to you by your health care provider. Make sure you discuss any questions you have with your health care provider. Document Revised: 11/25/2018 Document Reviewed: 02/03/2018 Elsevier Patient Education  2020 [place] for Sleep Apnea  Sleep apnea is a condition in which breathing pauses or becomes shallow during sleep. Sleep apnea screening is a test to determine if you are at risk for sleep apnea. The test is easy and only takes a few minutes. Your health care provider may ask you to have this test in preparation for surgery or as part of a physical exam. What are the symptoms of sleep apnea? Common symptoms of sleep apnea include:  Snoring.  Restless sleep.  Daytime sleepiness.  Pauses in breathing.  Choking during sleep.  Irritability.  Forgetfulness.  Trouble thinking clearly.  Depression.  Personality changes. Most people with sleep apnea are not aware that they have it. Why should I get screened? Getting screened for sleep apnea can help:  Ensure your safety. It is important for your health care providers to know whether or not you have sleep apnea, especially if you are having surgery or have other long-term (chronic) health conditions.  Improve your health and allow you to get a better night's rest. Restful sleep can help you: ? Have more energy. ? Lose weight. ? Improve high blood pressure. ? Improve diabetes management. ? Prevent stroke. ? Prevent car accidents. How is screening done? Screening usually includes being asked a list of questions about your sleep  quality. Some questions  you may be asked include:  Do you snore?  Is your sleep restless?  Do you have daytime sleepiness?  Has a partner or spouse told you that you stop breathing during sleep?  Have you had trouble concentrating or memory loss? If your screening test is positive, you are at risk for the condition. Further testing may be needed to confirm a diagnosis of sleep apnea. Where to find more information You can find screening tools online or at your health care clinic. For more information about sleep apnea screening and healthy sleep, visit these websites:  Centers for Disease Control and Prevention: [employer]  American Sleep Apnea Association: www.sleepapnea.org Contact a health care provider if:  You think that you may have sleep apnea. Summary  Sleep apnea screening can help determine if you are at risk for sleep apnea.  It is important for your health care providers to know whether or not you have sleep apnea, especially if you are having surgery or have other chronic health conditions.  You may be asked to take a screening test for sleep apnea in preparation for surgery or as part of a physical exam. This information is not intended to replace advice given to you by your health care provider. Make sure you discuss any questions you have with your health care provider. Document Revised: 03/27/2018 Document Reviewed: 09/20/2016 Elsevier Patient Education  [place].

## 2020-01-11 NOTE — Progress Notes (Signed)
New Patient Office Visit  Subjective:  Patient ID: Derrick Dixon, male    DOB: 21-Feb-1986  Age: 34 y.o. MRN: [id number]  CC:  Chief Complaint  Patient presents with  . Establish Care    New patient, concerns about possible sleep apnea    HPI Derrick Dixon presents for establishment of care a physical exam and follow-up out of concern that he has about apnea.  Patient awakes throughout the night with his heart racing.  Does not feel rested in the morning.  He does snore.  No family history of apnea.  He does not smoke or use illicit drugs.  He does drink alcohol on occasion.  His father passed back in 2018 who had suffered mental health issues after a traumatic brain injury.  Mother passed back in this past December from aspiration that was believed to be associated with her chronic alcoholism.  Brother is schizophrenic.  Patient is definitely been sad with all of this recent trauma in his life.  He has been working on a holistic lady with exercise and meditation.  Past Medical History:  Diagnosis Date  . Anxiety     Past Surgical History:  Procedure Laterality Date  . KNEE SURGERY    . ORIF ANKLE FRACTURE  10/05/2019   Procedure: open reduction internal fixation medial malleolus fracture;  Surgeon: Wylene Simmer, MD;  Location: [place];  Service: Orthopedics;;  . SHOULDER SURGERY    . TIBIA IM NAIL INSERTION Right 10/05/2019   Procedure: Intramedullary nailing right tibia;  open reduction internal fixation right ankle posterior malleolus fracture;  Surgeon: Wylene Simmer, MD;  Location: [place];  Service: Orthopedics;  Laterality: Right;  34min    No family history on file.  Social History   Socioeconomic History  . Marital status: Single    Spouse name: Not on file  . Number of children: Not on file  . Years of education: Not on file  . Highest education level: Not on file  Occupational History  . Not on file  Tobacco Use  . Smoking status:  Never Smoker  . Smokeless tobacco: Former Network engineer  . Vaping Use: Never used  Substance and Sexual Activity  . Alcohol use: Yes    Comment: occasionally  . Drug use: No  . Sexual activity: Yes  Other Topics Concern  . Not on file  Social History Narrative  . Not on file   Social Determinants of Health   Financial Resource Strain:   . Difficulty of Paying Living Expenses:   Food Insecurity:   . Worried About Charity fundraiser in the Last Year:   . Arboriculturist in the Last Year:   Transportation Needs:   . Film/video editor (Medical):   Marland Kitchen Lack of Transportation (Non-Medical):   Physical Activity:   . Days of Exercise per Week:   . Minutes of Exercise per Session:   Stress:   . Feeling of Stress :   Social Connections:   . Frequency of Communication with Friends and Family:   . Frequency of Social Gatherings with Friends and Family:   . Attends Religious Services:   . Active Member of Clubs or Organizations:   . Attends Archivist Meetings:   Marland Kitchen Marital Status:   Intimate Partner Violence:   . Fear of Current or Ex-Partner:   . Emotionally Abused:   Marland Kitchen Physically Abused:   . Sexually Abused:  ROS Review of Systems  Constitutional: Negative.   HENT: Negative.   Eyes: Negative for photophobia and visual disturbance.  Respiratory: Negative.   Cardiovascular: Negative.   Gastrointestinal: Negative.   Endocrine: Negative for polyphagia and polyuria.  Genitourinary: Negative.   Musculoskeletal: Negative for gait problem and joint swelling.  Skin: Negative for pallor and rash.  Allergic/Immunologic: Negative for immunocompromised state.  Neurological: Negative for speech difficulty and light-headedness.  Hematological: Does not bruise/bleed easily.    Objective:   Today's Vitals: BP 108/64   Pulse 85   Temp 98.3 F (36.8 C) (Tympanic)   Ht 6\' 3"  (1.905 m)   Wt 165 lb 12.8 oz (75.2 kg)   SpO2 98%   BMI 20.72 kg/m   Physical  Exam Vitals and nursing note reviewed.  Constitutional:      General: He is not in acute distress.    Appearance: Normal appearance. He is normal weight. He is not ill-appearing, toxic-appearing or diaphoretic.  HENT:     Head: Normocephalic and atraumatic.     Right Ear: Tympanic membrane, ear canal and external ear normal.     Left Ear: There is impacted cerumen.     Nose: Nose normal.     Mouth/Throat:     Mouth: Mucous membranes are moist.     Pharynx: Oropharynx is clear. No oropharyngeal exudate or posterior oropharyngeal erythema.   Eyes:     General: No scleral icterus.       Right eye: No discharge.        Left eye: No discharge.     Extraocular Movements: Extraocular movements intact.     Conjunctiva/sclera: Conjunctivae normal.     Pupils: Pupils are equal, round, and reactive to light.  Cardiovascular:     Rate and Rhythm: Normal rate and regular rhythm.  Pulmonary:     Effort: Pulmonary effort is normal.     Breath sounds: Normal breath sounds.  Abdominal:     General: Abdomen is flat. Bowel sounds are normal. There is no distension.     Palpations: Abdomen is soft. There is no mass.     Tenderness: There is no abdominal tenderness. There is no guarding or rebound.     Hernia: No hernia is present. There is no hernia in the left inguinal area or right inguinal area.  Genitourinary:    Penis: Uncircumcised. No phimosis, paraphimosis, hypospadias, erythema, tenderness, discharge, swelling or lesions.      Testes:        Right: Mass, tenderness or swelling not present. Right testis is descended. Cremasteric reflex is present.         Left: Mass, tenderness or swelling not present. Left testis is descended.     Epididymis:     Right: Not inflamed or enlarged.     Left: Not inflamed or enlarged.  Musculoskeletal:     Cervical back: Normal range of motion and neck supple. No rigidity or tenderness.     Right lower leg: No edema.     Left lower leg: No edema.   Lymphadenopathy:     Cervical: No cervical adenopathy.     Lower Body: No right inguinal adenopathy. No left inguinal adenopathy.  Skin:    General: Skin is warm and dry.  Neurological:     Mental Status: He is alert and oriented to person, place, and time.  Psychiatric:        Mood and Affect: Mood normal.        Behavior:  Behavior normal.     Assessment & Plan:   Problem List Items Addressed This Visit      Other   Apnea   Relevant Orders   Ambulatory referral to Sleep Studies   Healthcare maintenance - Primary   Relevant Orders   CBC   Comprehensive metabolic panel   LDL cholesterol, direct   HIV Antibody (routine testing w rflx)   Lipid panel   Urinalysis, Routine w reflex microscopic   Dysthymia      Outpatient Encounter Medications as of 01/11/2020  Medication Sig  . azelastine (ASTELIN) 0.1 % nasal spray Place into the nose.  Marland Kitchen CALCIUM PO Take by mouth.  Marland Kitchen VITAMIN D PO Take by mouth.  . docusate sodium (COLACE) 100 MG capsule Take 1 capsule (100 mg total) by mouth daily as needed. (Patient not taking: Reported on 01/11/2020)  . ibuprofen (ADVIL) 800 MG tablet Take 800 mg by mouth every 8 (eight) hours as needed. (Patient not taking: Reported on 01/11/2020)  . senna (SENOKOT) 8.6 MG TABS tablet Take 2 tablets (17.2 mg total) by mouth 2 (two) times daily. (Patient not taking: Reported on 01/11/2020)  . terbinafine (LAMISIL) 250 MG tablet Take 1 tablet (250 mg total) by mouth daily. X 14 days (Patient not taking: Reported on 01/11/2020)   No facility-administered encounter medications on file as of 01/11/2020.    Follow-up: Return in about 6 months (around 07/13/2020), or if symptoms worsen or fail to improve, for return fasting for ordered blood work. . Given information on health maintenance and disease prevention as well as apnea.  He would like to see if treating apnea would help his emotional disposition.  He will follow-up with me if he would like to discuss  counseling and/or medication therapy will let me know how the lesion his mouth is doing in a few weeks.  If it is not resolved we will consider referral.  Return fasting for above ordered blood work. Libby Maw, MD

## 2020-01-17 ENCOUNTER — Other Ambulatory Visit: Payer: Self-pay

## 2020-01-18 ENCOUNTER — Other Ambulatory Visit (INDEPENDENT_AMBULATORY_CARE_PROVIDER_SITE_OTHER): Payer: 59

## 2020-01-18 DIAGNOSIS — Z0000 Encounter for general adult medical examination without abnormal findings: Secondary | ICD-10-CM

## 2020-01-18 LAB — CBC
HCT: 44.4 % (ref 39.0–52.0)
Hemoglobin: 15 g/dL (ref 13.0–17.0)
MCHC: 33.9 g/dL (ref 30.0–36.0)
MCV: 88.9 fl (ref 78.0–100.0)
Platelets: 263 10*3/uL (ref 150.0–400.0)
RBC: 5 Mil/uL (ref 4.22–5.81)
RDW: 12.6 % (ref 11.5–15.5)
WBC: 3.8 10*3/uL — ABNORMAL LOW (ref 4.0–10.5)

## 2020-01-18 LAB — COMPREHENSIVE METABOLIC PANEL
ALT: 14 U/L (ref 0–53)
AST: 25 U/L (ref 0–37)
Albumin: 4.4 g/dL (ref 3.5–5.2)
Alkaline Phosphatase: 78 U/L (ref 39–117)
BUN: 16 mg/dL (ref 6–23)
CO2: 26 mEq/L (ref 19–32)
Calcium: 9.3 mg/dL (ref 8.4–10.5)
Chloride: 104 mEq/L (ref 96–112)
Creatinine, Ser: 0.94 mg/dL (ref 0.40–1.50)
GFR: 92.72 mL/min (ref >60.00)
Glucose, Bld: 99 mg/dL (ref 70–99)
Potassium: 4.4 mEq/L (ref 3.5–5.1)
Sodium: 136 mEq/L (ref 135–145)
Total Bilirubin: 1.6 mg/dL — ABNORMAL HIGH (ref 0.2–1.2)
Total Protein: 7 g/dL (ref 6.0–8.3)

## 2020-01-18 LAB — URINALYSIS, ROUTINE W REFLEX MICROSCOPIC
Bilirubin Urine: NEGATIVE
Hgb urine dipstick: NEGATIVE
Ketones, ur: NEGATIVE
Leukocytes,Ua: NEGATIVE
Nitrite: NEGATIVE
Specific Gravity, Urine: 1.025 (ref 1.000–1.030)
Total Protein, Urine: NEGATIVE
Urine Glucose: NEGATIVE
Urobilinogen, UA: 0.2 (ref 0.0–1.0)
pH: 5.5 (ref 5.0–8.0)

## 2020-01-18 LAB — LIPID PANEL
Cholesterol: 182 mg/dL (ref 0–200)
HDL: 38.8 mg/dL — ABNORMAL LOW (ref >39.00)
LDL Cholesterol: 122 mg/dL — ABNORMAL HIGH (ref 0–99)
NonHDL: 143.5
Total CHOL/HDL Ratio: 5
Triglycerides: 106 mg/dL (ref 0.0–149.0)
VLDL: 21.2 mg/dL (ref 0.0–40.0)

## 2020-01-18 LAB — LDL CHOLESTEROL, DIRECT: Direct LDL: 130 mg/dL

## 2020-01-19 LAB — HIV ANTIBODY (ROUTINE TESTING W REFLEX): HIV 1&2 Ab, 4th Generation: NONREACTIVE

## 2020-01-23 NOTE — Progress Notes (Signed)
Labs were mostly okay but Ldl cholesterol is slightly elevated. Please lower fat and cholesterol in diet. Recheck in a year.

## 2020-02-21 ENCOUNTER — Other Ambulatory Visit: Payer: Self-pay

## 2020-02-21 ENCOUNTER — Ambulatory Visit (INDEPENDENT_AMBULATORY_CARE_PROVIDER_SITE_OTHER): Payer: 59 | Admitting: Neurology

## 2020-02-21 ENCOUNTER — Encounter: Payer: Self-pay | Admitting: Neurology

## 2020-02-21 VITALS — BP 126/76 | HR 80 | Ht 75.0 in | Wt 168.0 lb

## 2020-02-21 DIAGNOSIS — R002 Palpitations: Secondary | ICD-10-CM

## 2020-02-21 DIAGNOSIS — R0683 Snoring: Secondary | ICD-10-CM

## 2020-02-21 DIAGNOSIS — G4734 Idiopathic sleep related nonobstructive alveolar hypoventilation: Secondary | ICD-10-CM | POA: Diagnosis not present

## 2020-02-21 DIAGNOSIS — R519 Headache, unspecified: Secondary | ICD-10-CM

## 2020-02-21 DIAGNOSIS — G4719 Other hypersomnia: Secondary | ICD-10-CM

## 2020-02-21 DIAGNOSIS — G478 Other sleep disorders: Secondary | ICD-10-CM

## 2020-02-21 NOTE — Patient Instructions (Signed)

## 2020-02-21 NOTE — Progress Notes (Signed)
Subjective:    Patient ID: Derrick Dixon is a 34 y.o. male.  HPI     Star Age, MD, PhD [facility] [address] [place], [place] [address]  Dear Dr. Ethelene Dixon,   I saw your patient, Derrick Dixon, upon your kind request, in my Sleep clinic today for initial consultation of his sleep disorder, in particular, concern for underlying obstructive sleep apnea.  The patient is unaccompanied today.  As you know, Derrick Dixon is a 34 year old right-handed gentleman with an underlying medical history of allergies, and anxiety, who reports snoring and excessive daytime somnolence, nonrestorative sleep.  I reviewed your office note from 01/11/2020.  His Epworth sleepiness score is 16/24, fatigue severity score is 51 out of 63.  He reports symptoms of sleep apnea for the past few years but he has been reluctant to seek evaluation for this.  He had recently purchased a pulse oximeter and has used it overnight, he has noticed desaturations into the mid 80s.  He has woken up with palpitations but has not been told that he stops breathing while asleep.  He has loud snoring, he uses nasal Breathe Right strips on a nightly basis.  He has woken up with a headache at times, this is a dull and achy headache, may last for about an hour and typically he does not end up taking anything for this but if it is more lingering, he takes over-the-counter Tylenol or ibuprofen.  He does not have a known family history of sleep apnea.  He has no night to night nocturia.  He sometimes does not recall dreaming.  He denies any sleep paralysis, hypnagogic or hypnopompic hallucinations or cataplexy.  He lives with his girlfriend, they have one dog in the household, dog sleeps in the bedroom but not on the bed with them, no TV in the bedroom.  He drinks caffeine in limitation, typically 1 cup of green tea in the mornings.  He drinks alcohol very occasionally and does not smoke cigarettes, occasional  cigar smoker.  He works as a IT sales professional.  He has a history of sleep talking.    His Past Medical History Is Significant For: Past Medical History:  Diagnosis Date  . Anxiety     His Past Surgical History Is Significant For: Past Surgical History:  Procedure Laterality Date  . KNEE SURGERY    . ORIF ANKLE FRACTURE  10/05/2019   Procedure: open reduction internal fixation medial malleolus fracture;  Surgeon: Wylene Simmer, MD;  Location: [place];  Service: Orthopedics;;  . SHOULDER SURGERY    . TIBIA IM NAIL INSERTION Right 10/05/2019   Procedure: Intramedullary nailing right tibia;  open reduction internal fixation right ankle posterior malleolus fracture;  Surgeon: Wylene Simmer, MD;  Location: [place];  Service: Orthopedics;  Laterality: Right;  52min    His Family History Is Significant For: No family history on file.  His Social History Is Significant For: Social History   Socioeconomic History  . Marital status: Single    Spouse name: Not on file  . Number of children: Not on file  . Years of education: Not on file  . Highest education level: Not on file  Occupational History  . Not on file  Tobacco Use  . Smoking status: Never Smoker  . Smokeless tobacco: Former Network engineer  . Vaping Use: Never used  Substance and Sexual Activity  . Alcohol use: Yes  Comment: occasionally  . Drug use: No  . Sexual activity: Yes  Other Topics Concern  . Not on file  Social History Narrative  . Not on file   Social Determinants of Health   Financial Resource Strain:   . Difficulty of Paying Living Expenses: Not on file  Food Insecurity:   . Worried About Charity fundraiser in the Last Year: Not on file  . Ran Out of Food in the Last Year: Not on file  Transportation Needs:   . Lack of Transportation (Medical): Not on file  . Lack of Transportation (Non-Medical): Not on file  Physical Activity:   . Days of Exercise per  Week: Not on file  . Minutes of Exercise per Session: Not on file  Stress:   . Feeling of Stress : Not on file  Social Connections:   . Frequency of Communication with Friends and Family: Not on file  . Frequency of Social Gatherings with Friends and Family: Not on file  . Attends Religious Services: Not on file  . Active Member of Clubs or Organizations: Not on file  . Attends Archivist Meetings: Not on file  . Marital Status: Not on file    His Allergies Are:  Allergies  Allergen Reactions  . Penicillins Rash    Other reaction(s): OTHER   :   His Current Medications Are:  Outpatient Encounter Medications as of 02/21/2020  Medication Sig  . Triamcinolone Acetonide (NASACORT ALLERGY 24HR NA) Place into the nose.  . [DISCONTINUED] azelastine (ASTELIN) 0.1 % nasal spray Place into the nose.  . [DISCONTINUED] CALCIUM PO Take by mouth.  . [DISCONTINUED] docusate sodium (COLACE) 100 MG capsule Take 1 capsule (100 mg total) by mouth daily as needed. (Patient not taking: Reported on 01/11/2020)  . [DISCONTINUED] ibuprofen (ADVIL) 800 MG tablet Take 800 mg by mouth every 8 (eight) hours as needed. (Patient not taking: Reported on 01/11/2020)  . [DISCONTINUED] senna (SENOKOT) 8.6 MG TABS tablet Take 2 tablets (17.2 mg total) by mouth 2 (two) times daily. (Patient not taking: Reported on 01/11/2020)  . [DISCONTINUED] terbinafine (LAMISIL) 250 MG tablet Take 1 tablet (250 mg total) by mouth daily. X 14 days (Patient not taking: Reported on 01/11/2020)  . [DISCONTINUED] VITAMIN D PO Take by mouth.   No facility-administered encounter medications on file as of 02/21/2020.  :  Review of Systems:  Out of a complete 14 point review of systems, all are reviewed and negative with the exception of these symptoms as listed below: Review of Systems  Neurological:       Here for sleep consult. No prior sleep study, reports snoring has been present in the past but nasal strips/spray helps.  Daytime sleepiness/fatigue is present.  Epworth Sleepiness Scale 0= would never doze 1= slight chance of dozing 2= moderate chance of dozing 3= high chance of dozing  Sitting and reading:3 Watching TV:2 Sitting inactive in a public place (ex. Theater or meeting):1 As a passenger in a car for an hour without a break:2 Lying down to rest in the afternoon:3 Sitting and talking to someone:1 Sitting quietly after lunch (no alcohol):3 In a car, while stopped in traffic:1 Total:16     Objective:  Neurological Exam  Physical Exam Physical Examination:   Vitals:   02/21/20 1306  BP: 126/76  Pulse: 80  SpO2: 97%    General Examination: The patient is a very pleasant 34 y.o. male in no acute distress. He appears well-developed and well-nourished  and well groomed.   HEENT: Normocephalic, atraumatic, pupils are equal, round and reactive to light, extraocular tracking is good without limitation to gaze excursion or nystagmus noted. Hearing is grossly intact. Face is symmetric with normal facial animation. Speech is clear with no dysarthria noted. There is no hypophonia. There is no lip, neck/head, jaw or voice tremor. Neck is supple with full range of passive and active motion. There are no carotid bruits on auscultation. Oropharynx exam reveals: mild mouth dryness, good dental hygiene and mild airway crowding, due to small airway entry, tonsillar size of about 1+, normal appearing uvula, tongue protrudes centrally in palate elevates symmetrically, Mallampati class II, he has a mild overbite.  Chest: Clear to auscultation without wheezing, rhonchi or crackles noted.  Heart: S1+S2+0, regular and normal without murmurs, rubs or gallops noted.   Abdomen: Soft, non-tender and non-distended with normal bowel sounds appreciated on auscultation.  Extremities: There is no pitting edema in the distal lower extremities bilaterally.   Skin: Warm and dry with several well-healed punctate scars in  the right distal lower extremity, he reports injuries while mountain biking.    Musculoskeletal: exam reveals no obvious joint deformities, tenderness or joint swelling or erythema, status post surgery to the right leg from ankle and tibia fracture.   Neurologically:  Mental status: The patient is awake, alert and oriented in all 4 spheres. His immediate and remote memory, attention, language skills and fund of knowledge are appropriate. There is no evidence of aphasia, agnosia, apraxia or anomia. Speech is clear with normal prosody and enunciation. Thought process is linear. Mood is normal and affect is normal.  Cranial nerves II - XII are as described above under HEENT exam.  Motor exam: Normal bulk, strength and tone is noted. There is no tremor, Romberg is negative. Fine motor skills and coordination: grossly intact.  Cerebellar testing: No dysmetria or intention tremor. There is no truncal or gait ataxia.  Sensory exam: intact to light touch in the upper and lower extremities.  Gait, station and balance: He stands easily. No veering to one side is noted. No leaning to one side is noted. Posture is age-appropriate and stance is narrow based. Gait shows normal stride length and normal pace. No problems turning are noted. Tandem walk is unremarkable.                Assessment and Plan:   In summary, Khair Chasteen is a very pleasant 34 y.o.-year old male with an underlying medical history of allergies, and anxiety, whose history and physical exam are concerning for obstructive sleep apnea (OSA). I had a long chat with the patient about my findings and the diagnosis of OSA, its prognosis and treatment options. We talked about medical treatments, surgical interventions and non-pharmacological approaches. I explained in particular the risks and ramifications of untreated moderate to severe OSA, especially with respect to developing cardiovascular disease down the Road, including congestive heart failure,  difficult to treat hypertension, cardiac arrhythmias, or stroke. Even type 2 diabetes has, in part, been linked to untreated OSA. Symptoms of untreated OSA include daytime sleepiness, memory problems, mood irritability and mood disorder such as depression and anxiety, lack of energy, as well as recurrent headaches, especially morning headaches. We talked about trying to maintain a healthy lifestyle in general, as well as the importance of weight control. We also talked about the importance of good sleep hygiene. I recommended the following at this time: sleep study.   I explained the sleep test  procedure to the patient and also outlined possible surgical and non-surgical treatment options of OSA, including the use of a custom-made dental device (which would require a referral to a specialist dentist or oral surgeon), upper airway surgical options, such as traditional UPPP or a novel less invasive surgical option in the form of Inspire hypoglossal nerve stimulation (which would involve a referral to an ENT surgeon). I also explained the CPAP treatment option to the patient, who indicated that he would be willing to try CPAP if the need arises, but would probably prefer an oral appliance. I explained the importance of being compliant with PAP treatment, not only for insurance purposes but primarily to improve His symptoms, and for the patient's long term health benefit, including to reduce His cardiovascular risks. I answered all his questions today and the patient was in agreement. I plan to see him back after the sleep study is completed and encouraged him to call with any interim questions, concerns, problems or updates.   Thank you very much for allowing me to participate in the care of this nice patient. If I can be of any further assistance to you please do not hesitate to call me at [phone].  Sincerely,   Star Age, MD, PhD

## 2020-03-27 ENCOUNTER — Ambulatory Visit (INDEPENDENT_AMBULATORY_CARE_PROVIDER_SITE_OTHER): Payer: 59 | Admitting: Neurology

## 2020-03-27 DIAGNOSIS — R519 Headache, unspecified: Secondary | ICD-10-CM

## 2020-03-27 DIAGNOSIS — G478 Other sleep disorders: Secondary | ICD-10-CM

## 2020-03-27 DIAGNOSIS — G4719 Other hypersomnia: Secondary | ICD-10-CM

## 2020-03-27 DIAGNOSIS — G4733 Obstructive sleep apnea (adult) (pediatric): Secondary | ICD-10-CM | POA: Diagnosis not present

## 2020-03-27 DIAGNOSIS — G4734 Idiopathic sleep related nonobstructive alveolar hypoventilation: Secondary | ICD-10-CM

## 2020-03-27 DIAGNOSIS — R0683 Snoring: Secondary | ICD-10-CM

## 2020-03-27 DIAGNOSIS — R002 Palpitations: Secondary | ICD-10-CM

## 2020-03-28 ENCOUNTER — Other Ambulatory Visit: Payer: Self-pay

## 2020-03-30 NOTE — Procedures (Signed)
Sleep Study Report   Patient Information     First Name: Derrick Last Name: Dixon ID: [id number]  Birth Date: 05/30/1986 Age: 34 Gender: Male  Referring Provider: Libby Maw, MD BMI: 21.1 (W=167 lb, H=6' 3'')  Neck Circ.:  16 '' Epworth:  16/24   Sleep Study Information    Study Date: 03/27/20 S/H/A Version: [id number] / 4.2.1023 / 7  History:    34 year old man with a history of allergies, and anxiety, who reports snoring and excessive daytime somnolence, nonrestorative sleep. Summary & Diagnosis:    Snoring   Recommendations:     This home sleep test does not demonstrate any significant obstructive or central sleep disordered breathing. Overall AHI was less than 5/hour (4.1/hour), O2 nadir was 88%. Some snoring was noted and appeared to be mild and intermittent. Other causes of the patient's symptoms, including circadian rhythm disturbances, an underlying mood disorder, medication effect and/or an underlying medical problem cannot be ruled out based on this test. Clinical correlation is recommended. The patient should be cautioned not to drive, work at heights, or operate dangerous or heavy equipment when tired or sleepy. Review and reiteration of good sleep hygiene measures should be pursued with any patient. The patient can follow up with his referring provider, who will be notified of the test results.   I certify that I have reviewed the raw data recording prior to the issuance of this report in accordance with the standards of the American Academy of Sleep Medicine (AASM).  Star Age, MD, PhD Diplomat, ABPN (Neurology and Sleep)               Sleep Summary  Oxygen Saturation Statistics   Start Study Time: End Study Time: Total Recording Time:          11:44:01 PM 9:00:55 AM   9 h, 16 min  Total Sleep Time % REM of Sleep Time:  8 h, 9 min  26.1    Mean: 94 Minimum: 88 Maximum: 98  Mean of Desaturations Nadirs (%):   91  Oxygen Desaturation. %: 4-9  10-20 >20 Total  Events Number Total  9 100.0  0 0.0  0 0.0  9 100.0  Oxygen Saturation: <90 <=88 <85 <80 <70  Duration (minutes): Sleep % 0.1 0.1 0.0 0.0 0.0 0.0 0.0 0.0 0.0 0.0     Respiratory Indices      Total Events REM NREM All Night  pRDI: pAHI 3%: ODI 4%: pAHIc 3%: % CSR: pAHI 4%:  88  33  9  0 0.0 8 19.1 7.6 2.4 0.0 8.0 2.8 0.7 0.0 10.9 4.1 1.1 0.0 1.0       Pulse Rate Statistics during Sleep (BPM)      Mean: 59 Minimum: 43 Maximum: 97    Indices are calculated using technically valid sleep time of  8 h, 6 min.         5              15                    30        pAHI=4.1                                                         Mild  Moderate                    Severe           Body Position Statistics  Position Supine Prone Right Left Non-Supine  Sleep (min) 227.9 0.0 25.5 202.0 227.5  Sleep % 46.6 0.0 5.2 41.3 46.5  pRDI 11.7 N/A 16.6 9.0 9.8  pAHI 3% 4.5 N/A 2.4 3.9 3.7  ODI 4% 1.6 N/A 0.0 0.9 0.8            Left   Right  Supine    Snoring Statistics Snoring Level (dB) >40 >50 >60 >70 >80 >Threshold (45)  Sleep (min) 394.7 7.5 3.1 0.0 0.0 11.3  Sleep % 80.6 1.5 0.6 0.0 0.0 2.3    Mean: 41 dB Sleep Stages Chart                       Wake  Sleep      Wake  12.12  %    Sleep  87.88  %   Total:  100.00  %                                                       REM  Light  Deep      REM  26.05  %    Light  61.28  %    Deep  12.67  %   Total:  100.00  %                                 Sleep/Wake States  Sleep Stages  Sleep Latency (min):  REM Latency (min):  Number of Wakes:   22   88   11

## 2020-03-30 NOTE — Progress Notes (Signed)
Patient referred by Dr. Ethelene Hal for concern for OSA, seen by me on 02/21/20, HST on 03/27/20.   Please call and notify the patient that the recent home sleep test did not show any significant obstructive sleep apnea, overall AHI was less than 5/hour and O2 nadir 88%. Treatment with CPAP or autoPAP is not recommended; some snoring was noted, mostly mild and intermittent. He can follow up with the referring provider.  Thanks,  Star Age, MD, PhD [facility])

## 2020-04-03 ENCOUNTER — Telehealth: Payer: Self-pay

## 2020-04-03 NOTE — Telephone Encounter (Signed)
-----   Message from Star Age, MD sent at 03/30/2020  5:31 PM EDT ----- Patient referred by Dr. Ethelene Hal for concern for OSA, seen by me on 02/21/20, HST on 03/27/20.   Please call and notify the patient that the recent home sleep test did not show any significant obstructive sleep apnea, overall AHI was less than 5/hour and O2 nadir 88%. Treatment with CPAP or autoPAP is not recommended; some snoring was noted, mostly mild and intermittent. He can follow up with the referring provider.  Thanks,  Star Age, MD, PhD [facility])

## 2020-04-03 NOTE — Telephone Encounter (Signed)
I called the pt and advised of results. He verbalized understanding and had no questions/ concerns on recent hst results.  Pt's report has been sent to PCP.

## 2020-07-17 ENCOUNTER — Other Ambulatory Visit (HOSPITAL_COMMUNITY): Payer: Self-pay | Admitting: Orthopedic Surgery

## 2020-08-23 ENCOUNTER — Encounter (HOSPITAL_BASED_OUTPATIENT_CLINIC_OR_DEPARTMENT_OTHER): Payer: Self-pay | Admitting: Orthopedic Surgery

## 2020-08-23 ENCOUNTER — Other Ambulatory Visit: Payer: Self-pay

## 2020-08-28 ENCOUNTER — Other Ambulatory Visit (HOSPITAL_COMMUNITY): Payer: 59

## 2020-08-30 ENCOUNTER — Other Ambulatory Visit (HOSPITAL_COMMUNITY)
Admission: RE | Admit: 2020-08-30 | Discharge: 2020-08-30 | Disposition: A | Discharge status: Home / Self Care | Payer: 59 | Source: Ambulatory Visit | Attending: Orthopedic Surgery | Admitting: Orthopedic Surgery

## 2020-08-30 DIAGNOSIS — Z01812 Encounter for preprocedural laboratory examination: Secondary | ICD-10-CM | POA: Diagnosis present

## 2020-08-30 DIAGNOSIS — Z20822 Contact with and (suspected) exposure to covid-19: Secondary | ICD-10-CM | POA: Insufficient documentation

## 2020-08-30 LAB — SARS CORONAVIRUS 2 (TAT 6-24 HRS): SARS Coronavirus 2: NEGATIVE

## 2020-08-30 NOTE — Anesthesia Preprocedure Evaluation (Addendum)
Anesthesia Evaluation  Patient identified by MRN, date of birth, ID band Patient awake    Reviewed: Allergy & Precautions, NPO status , Patient's Chart, lab work & pertinent test results  History of Anesthesia Complications Negative for: history of anesthetic complications  Airway Mallampati: II  TM Distance: >3 FB Neck ROM: Full    Dental no notable dental hx. (+) Teeth Intact, Dental Advisory Given   Pulmonary neg pulmonary ROS,    Pulmonary exam normal breath sounds clear to auscultation       Cardiovascular negative cardio ROS Normal cardiovascular exam Rhythm:Regular Rate:Normal     Neuro/Psych PSYCHIATRIC DISORDERS Anxiety Depression negative neurological ROS     GI/Hepatic negative GI ROS, Neg liver ROS,   Endo/Other  negative endocrine ROS  Renal/GU negative Renal ROS  negative genitourinary   Musculoskeletal negative musculoskeletal ROS (+) displaced transverse right tibia fracture   Abdominal   Peds negative pediatric ROS (+)  Hematology negative hematology ROS (+)   Anesthesia Other Findings Day of surgery medications reviewed with patient.  Reproductive/Obstetrics negative OB ROS                            Anesthesia Physical  Anesthesia Plan  ASA: I  Anesthesia Plan: MAC   Post-op Pain Management:    Induction: Intravenous  PONV Risk Score and Plan: 2 and Treatment may vary due to age or medical condition, Ondansetron, Dexamethasone and Midazolam  Airway Management Planned: Natural Airway and Nasal Cannula  Additional Equipment: None  Intra-op Plan:   Post-operative Plan:   Informed Consent: I have reviewed the patients History and Physical, chart, labs and discussed the procedure including the risks, benefits and alternatives for the proposed anesthesia with the patient or authorized representative who has indicated his/her understanding and acceptance.      Dental advisory given  Plan Discussed with: CRNA and Anesthesiologist  Anesthesia Plan Comments:        Anesthesia Quick Evaluation

## 2020-08-30 NOTE — Progress Notes (Signed)

## 2020-08-31 ENCOUNTER — Encounter (HOSPITAL_BASED_OUTPATIENT_CLINIC_OR_DEPARTMENT_OTHER): Payer: Self-pay | Admitting: Orthopedic Surgery

## 2020-08-31 ENCOUNTER — Encounter (HOSPITAL_BASED_OUTPATIENT_CLINIC_OR_DEPARTMENT_OTHER)
Admission: RE | Disposition: A | Discharge status: Home / Self Care | Payer: Self-pay | Source: Home / Self Care | Attending: Orthopedic Surgery

## 2020-08-31 ENCOUNTER — Ambulatory Visit (HOSPITAL_BASED_OUTPATIENT_CLINIC_OR_DEPARTMENT_OTHER)
Admission: RE | Admit: 2020-08-31 | Discharge: 2020-08-31 | Disposition: A | Discharge status: Home / Self Care | Payer: 59 | Attending: Orthopedic Surgery | Admitting: Orthopedic Surgery

## 2020-08-31 ENCOUNTER — Ambulatory Visit (HOSPITAL_BASED_OUTPATIENT_CLINIC_OR_DEPARTMENT_OTHER): Payer: 59 | Admitting: Anesthesiology

## 2020-08-31 DIAGNOSIS — Z880 Allergy status to penicillin: Secondary | ICD-10-CM | POA: Diagnosis not present

## 2020-08-31 DIAGNOSIS — T8484XA Pain due to internal orthopedic prosthetic devices, implants and grafts, initial encounter: Secondary | ICD-10-CM | POA: Diagnosis not present

## 2020-08-31 DIAGNOSIS — Y798 Miscellaneous orthopedic devices associated with adverse incidents, not elsewhere classified: Secondary | ICD-10-CM | POA: Insufficient documentation

## 2020-08-31 DIAGNOSIS — Z969 Presence of functional implant, unspecified: Secondary | ICD-10-CM

## 2020-08-31 HISTORY — DX: Gilbert syndrome: E80.4

## 2020-08-31 HISTORY — PX: HARDWARE REMOVAL: SHX979

## 2020-08-31 SURGERY — REMOVAL, HARDWARE
Anesthesia: Monitor Anesthesia Care | Site: Leg Lower | Laterality: Right

## 2020-08-31 MED ORDER — OXYCODONE HCL 5 MG/5ML PO SOLN: 5.0000 mg | Freq: Once | ORAL | Status: AC | PRN

## 2020-08-31 MED ORDER — PHENYLEPHRINE 40 MCG/ML (10ML) SYRINGE FOR IV PUSH (FOR BLOOD PRESSURE SUPPORT)
PREFILLED_SYRINGE | INTRAVENOUS | Status: AC
  Filled 2020-08-31: qty 20

## 2020-08-31 MED ORDER — SODIUM CHLORIDE 0.9 % IV SOLN: INTRAVENOUS | Status: DC

## 2020-08-31 MED ORDER — BUPIVACAINE-EPINEPHRINE (PF) 0.5% -1:200000 IJ SOLN
INTRAMUSCULAR | Status: AC
  Filled 2020-08-31: qty 30

## 2020-08-31 MED ORDER — DEXAMETHASONE SODIUM PHOSPHATE 10 MG/ML IJ SOLN
INTRAMUSCULAR | Status: AC
  Filled 2020-08-31: qty 2

## 2020-08-31 MED ORDER — OXYCODONE HCL 5 MG PO TABS
5.0000 mg | ORAL_TABLET | Freq: Once | ORAL | Status: AC | PRN
Start: 2020-08-31 — End: 2020-08-31
  Administered 2020-08-31: 5 mg via ORAL

## 2020-08-31 MED ORDER — ONDANSETRON HCL 4 MG/2ML IJ SOLN: 4.0000 mg | Freq: Once | INTRAMUSCULAR | Status: DC | PRN

## 2020-08-31 MED ORDER — CEFAZOLIN SODIUM-DEXTROSE 2-4 GM/100ML-% IV SOLN
2.0000 g | INTRAVENOUS | Status: AC
  Administered 2020-08-31: 2 g via INTRAVENOUS

## 2020-08-31 MED ORDER — ACETAMINOPHEN 160 MG/5ML PO SOLN: 325.0000 mg | ORAL | Status: DC | PRN

## 2020-08-31 MED ORDER — FENTANYL CITRATE (PF) 100 MCG/2ML IJ SOLN
INTRAMUSCULAR | Status: DC | PRN
  Administered 2020-08-31: 50 ug via INTRAVENOUS

## 2020-08-31 MED ORDER — LIDOCAINE 2% (20 MG/ML) 5 ML SYRINGE
INTRAMUSCULAR | Status: AC
  Filled 2020-08-31: qty 30

## 2020-08-31 MED ORDER — EPHEDRINE 5 MG/ML INJ
INTRAVENOUS | Status: AC
  Filled 2020-08-31: qty 10

## 2020-08-31 MED ORDER — PROPOFOL 500 MG/50ML IV EMUL
INTRAVENOUS | Status: AC
  Filled 2020-08-31: qty 100

## 2020-08-31 MED ORDER — HYDROCODONE-ACETAMINOPHEN 5-325 MG PO TABS: 1.0000 | ORAL_TABLET | Freq: Four times a day (QID) | ORAL | 0 refills | Status: DC | PRN

## 2020-08-31 MED ORDER — CEFAZOLIN SODIUM-DEXTROSE 2-4 GM/100ML-% IV SOLN
INTRAVENOUS | Status: AC
  Filled 2020-08-31: qty 100

## 2020-08-31 MED ORDER — MEPERIDINE HCL 25 MG/ML IJ SOLN: 6.2500 mg | INTRAMUSCULAR | Status: DC | PRN

## 2020-08-31 MED ORDER — ONDANSETRON HCL 4 MG/2ML IJ SOLN
INTRAMUSCULAR | Status: AC
  Filled 2020-08-31: qty 22

## 2020-08-31 MED ORDER — LACTATED RINGERS IV SOLN: INTRAVENOUS | Status: DC

## 2020-08-31 MED ORDER — BUPIVACAINE-EPINEPHRINE 0.5% -1:200000 IJ SOLN
INTRAMUSCULAR | Status: DC | PRN
  Administered 2020-08-31: 7 mL

## 2020-08-31 MED ORDER — PROPOFOL 500 MG/50ML IV EMUL
INTRAVENOUS | Status: DC | PRN
  Administered 2020-08-31: 125 ug/kg/min via INTRAVENOUS

## 2020-08-31 MED ORDER — FENTANYL CITRATE (PF) 100 MCG/2ML IJ SOLN
INTRAMUSCULAR | Status: AC
  Filled 2020-08-31: qty 2

## 2020-08-31 MED ORDER — FENTANYL CITRATE (PF) 100 MCG/2ML IJ SOLN: 25.0000 ug | INTRAMUSCULAR | Status: DC | PRN

## 2020-08-31 MED ORDER — ONDANSETRON HCL 4 MG/2ML IJ SOLN
INTRAMUSCULAR | Status: DC | PRN
  Administered 2020-08-31: 4 mg via INTRAVENOUS

## 2020-08-31 MED ORDER — MIDAZOLAM HCL 2 MG/2ML IJ SOLN
INTRAMUSCULAR | Status: AC
  Filled 2020-08-31: qty 2

## 2020-08-31 MED ORDER — ACETAMINOPHEN 325 MG PO TABS: 325.0000 mg | ORAL_TABLET | ORAL | Status: DC | PRN

## 2020-08-31 MED ORDER — LIDOCAINE 2% (20 MG/ML) 5 ML SYRINGE
INTRAMUSCULAR | Status: DC | PRN
  Administered 2020-08-31: 60 mg via INTRAVENOUS

## 2020-08-31 MED ORDER — BUPIVACAINE HCL (PF) 0.25 % IJ SOLN
INTRAMUSCULAR | Status: AC
  Filled 2020-08-31: qty 30

## 2020-08-31 MED ORDER — OXYCODONE HCL 5 MG PO TABS
ORAL_TABLET | ORAL | Status: AC
  Filled 2020-08-31: qty 1

## 2020-08-31 MED ORDER — BUPIVACAINE HCL (PF) 0.5 % IJ SOLN
INTRAMUSCULAR | Status: AC
  Filled 2020-08-31: qty 30

## 2020-08-31 SURGICAL SUPPLY — 69 items
APL PRP STRL LF DISP 70% ISPRP (MISCELLANEOUS) ×1
APL SKNCLS STERI-STRIP NONHPOA (GAUZE/BANDAGES/DRESSINGS)
BANDAGE ESMARK 6X9 LF (GAUZE/BANDAGES/DRESSINGS) IMPLANT
BENZOIN TINCTURE PRP APPL 2/3 (GAUZE/BANDAGES/DRESSINGS) IMPLANT
BLADE SURG 15 STRL LF DISP TIS (BLADE) ×2 IMPLANT
BLADE SURG 15 STRL SS (BLADE) ×4
BNDG CMPR 9X4 STRL LF SNTH (GAUZE/BANDAGES/DRESSINGS)
BNDG CMPR 9X6 STRL LF SNTH (GAUZE/BANDAGES/DRESSINGS)
BNDG COHESIVE 4X5 TAN STRL (GAUZE/BANDAGES/DRESSINGS) IMPLANT
BNDG COHESIVE 6X5 TAN STRL LF (GAUZE/BANDAGES/DRESSINGS) IMPLANT
BNDG ELASTIC 4X5.8 VLCR STR LF (GAUZE/BANDAGES/DRESSINGS) ×2 IMPLANT
BNDG ELASTIC 6X5.8 VLCR STR LF (GAUZE/BANDAGES/DRESSINGS) ×2 IMPLANT
BNDG ESMARK 4X9 LF (GAUZE/BANDAGES/DRESSINGS) IMPLANT
BNDG ESMARK 6X9 LF (GAUZE/BANDAGES/DRESSINGS)
CHLORAPREP W/TINT 26 (MISCELLANEOUS) ×2 IMPLANT
COVER BACK TABLE 60X90IN (DRAPES) ×2 IMPLANT
COVER WAND RF STERILE (DRAPES) IMPLANT
CUFF TOURN SGL QUICK 34 (TOURNIQUET CUFF)
CUFF TRNQT CYL 34X4.125X (TOURNIQUET CUFF) IMPLANT
DECANTER SPIKE VIAL GLASS SM (MISCELLANEOUS) IMPLANT
DRAPE EXTREMITY T 121X128X90 (DISPOSABLE) ×2 IMPLANT
DRAPE OEC MINIVIEW 54X84 (DRAPES) ×2 IMPLANT
DRAPE SURG 17X23 STRL (DRAPES) IMPLANT
DRAPE U-SHAPE 47X51 STRL (DRAPES) ×2 IMPLANT
DRESSING MEPILEX FLEX 4X4 (GAUZE/BANDAGES/DRESSINGS) ×4 IMPLANT
DRSG MEPILEX FLEX 4X4 (GAUZE/BANDAGES/DRESSINGS) ×8
DRSG MEPITEL 4X7.2 (GAUZE/BANDAGES/DRESSINGS) ×2 IMPLANT
DRSG PAD ABDOMINAL 8X10 ST (GAUZE/BANDAGES/DRESSINGS) IMPLANT
ELECT REM PT RETURN 9FT ADLT (ELECTROSURGICAL) ×2
ELECTRODE REM PT RTRN 9FT ADLT (ELECTROSURGICAL) ×1 IMPLANT
GAUZE SPONGE 4X4 12PLY STRL (GAUZE/BANDAGES/DRESSINGS) ×2 IMPLANT
GLOVE SRG 8 PF TXTR STRL LF DI (GLOVE) ×2 IMPLANT
GLOVE SURG ENC MOIS LTX SZ8 (GLOVE) ×2 IMPLANT
GLOVE SURG LTX SZ8 (GLOVE) ×2 IMPLANT
GLOVE SURG POLYISO LF SZ7 (GLOVE) ×2 IMPLANT
GLOVE SURG UNDER POLY LF SZ7 (GLOVE) ×2 IMPLANT
GLOVE SURG UNDER POLY LF SZ8 (GLOVE) ×4
GOWN STRL REUS W/ TWL LRG LVL3 (GOWN DISPOSABLE) ×1 IMPLANT
GOWN STRL REUS W/ TWL XL LVL3 (GOWN DISPOSABLE) ×2 IMPLANT
GOWN STRL REUS W/TWL LRG LVL3 (GOWN DISPOSABLE) ×2
GOWN STRL REUS W/TWL XL LVL3 (GOWN DISPOSABLE) ×4
NEEDLE HYPO 22GX1.5 SAFETY (NEEDLE) ×2 IMPLANT
PACK BASIN DAY SURGERY FS (CUSTOM PROCEDURE TRAY) ×2 IMPLANT
PAD CAST 4YDX4 CTTN HI CHSV (CAST SUPPLIES) ×1 IMPLANT
PADDING CAST COTTON 4X4 STRL (CAST SUPPLIES) ×2
PADDING CAST COTTON 6X4 STRL (CAST SUPPLIES) IMPLANT
PENCIL SMOKE EVACUATOR (MISCELLANEOUS) ×2 IMPLANT
SANITIZER HAND PURELL 535ML FO (MISCELLANEOUS) ×2 IMPLANT
SHEET MEDIUM DRAPE 40X70 STRL (DRAPES) ×2 IMPLANT
SLEEVE SCD COMPRESS KNEE MED (STOCKING) ×2 IMPLANT
SPLINT FAST PLASTER 5X30 (CAST SUPPLIES)
SPLINT PLASTER CAST FAST 5X30 (CAST SUPPLIES) IMPLANT
SPONGE LAP 18X18 RF (DISPOSABLE) ×2 IMPLANT
STOCKINETTE 6  STRL (DRAPES) ×1
STOCKINETTE 6 STRL (DRAPES) ×1 IMPLANT
STRIP CLOSURE SKIN 1/2X4 (GAUZE/BANDAGES/DRESSINGS) IMPLANT
SUCTION FRAZIER HANDLE 10FR (MISCELLANEOUS)
SUCTION TUBE FRAZIER 10FR DISP (MISCELLANEOUS) IMPLANT
SUT ETHILON 3 0 PS 1 (SUTURE) IMPLANT
SUT MNCRL AB 3-0 PS2 18 (SUTURE) ×2 IMPLANT
SUT VIC AB 0 SH 27 (SUTURE) IMPLANT
SUT VIC AB 2-0 SH 18 (SUTURE) IMPLANT
SUT VIC AB 2-0 SH 27 (SUTURE)
SUT VIC AB 2-0 SH 27XBRD (SUTURE) IMPLANT
SYR BULB EAR ULCER 3OZ GRN STR (SYRINGE) ×2 IMPLANT
SYR CONTROL 10ML LL (SYRINGE) IMPLANT
TOWEL GREEN STERILE FF (TOWEL DISPOSABLE) ×2 IMPLANT
TUBE CONNECTING 20X1/4 (TUBING) IMPLANT
UNDERPAD 30X36 HEAVY ABSORB (UNDERPADS AND DIAPERS) ×2 IMPLANT

## 2020-08-31 NOTE — Addendum Note (Signed)
Addendum  created 08/31/20 1318 by Janeece Riggers, MD   Attestation recorded in [place], [place] accepted, Intraprocedure Attestations deleted, [place] filed

## 2020-08-31 NOTE — Transfer of Care (Signed)
Immediate Anesthesia Transfer of Care Note  Patient: Derrick Dixon  Procedure(s) Performed: Removal of hardware right proximal and distal tibia interlocks and distal tibia 4.0 screw (Right Leg Lower)  Patient Location: PACU  Anesthesia Type:MAC  Level of Consciousness: awake, alert , oriented and patient cooperative  Airway & Oxygen Therapy: Patient Spontanous Breathing and Patient connected to face mask oxygen  Post-op Assessment: Report given to RN and Post -op Vital signs reviewed and stable  Post vital signs: Reviewed and stable  Last Vitals:  Vitals Value Taken Time  BP 97/62 08/31/20 0818  Temp    Pulse    Resp 15 08/31/20 0819  SpO2    Vitals shown include unvalidated device data.  Last Pain:  Vitals:   08/31/20 0621  TempSrc: Oral  PainSc: 0-No pain      Patients Stated Pain Goal: 3 (74/25/95 [id number])  Complications: No complications documented.

## 2020-08-31 NOTE — Anesthesia Procedure Notes (Signed)
Procedure Name: MAC Date/Time: 08/31/2020 7:30 AM Performed by: Signe Colt, CRNA Pre-anesthesia Checklist: Patient identified, Emergency Drugs available, Suction available, Patient being monitored and Timeout performed Patient Re-evaluated:Patient Re-evaluated prior to induction Oxygen Delivery Method: Simple face mask

## 2020-08-31 NOTE — Discharge Instructions (Addendum)
Wylene Simmer, MD [facility]  Please read the following information regarding your care after surgery.  Medications  You only need a prescription for the narcotic pain medicine (ex. oxycodone, Percocet, Norco).  All of the other medicines listed below are available over the counter. X Aleve 2 pills twice a day for the first 3 days after surgery. X acetominophen (Tylenol) 650 mg every 4-6 hours as you need for minor to moderate pain X hydrocodone as prescribed for severe pain  Weight Bearing X Bear weight when you are able on your operated leg or foot.  Cast / Splint / Dressing X Remove your dressing 3 days after surgery and cover the incisions with dry dressings.    After your dressing, cast or splint is removed; you may shower, but do not soak or scrub the wound.  Allow the water to run over it, and then gently pat it dry.  Swelling It is normal for you to have swelling where you had surgery.  To reduce swelling and pain, keep your toes above your nose for at least 3 days after surgery.  It may be necessary to keep your foot or leg elevated for several weeks.  If it hurts, it should be elevated.  Follow Up Call my office at [phone] when you are discharged from the hospital or surgery center to schedule an appointment to be seen two weeks after surgery.  Call my office at [phone] if you develop a fever >101.5 F, nausea, vomiting, bleeding from the surgical site or severe pain.     Post Anesthesia Home Care Instructions  Activity: Get plenty of rest for the remainder of the day. A responsible individual must stay with you for 24 hours following the procedure.  For the next 24 hours, DO NOT: -Drive a car -Paediatric nurse -Drink alcoholic beverages -Take any medication unless instructed by your physician -Make any legal decisions or sign important papers.  Meals: Start with liquid foods such as gelatin or soup. Progress to regular foods as tolerated. Avoid greasy,  spicy, heavy foods. If nausea and/or vomiting occur, drink only clear liquids until the nausea and/or vomiting subsides. Call your physician if vomiting continues.  Special Instructions/Symptoms: Your throat may feel dry or sore from the anesthesia or the breathing tube placed in your throat during surgery. If this causes discomfort, gargle with warm salt water. The discomfort should disappear within 24 hours.  If you had a scopolamine patch placed behind your ear for the management of post- operative nausea and/or vomiting:  1. The medication in the patch is effective for 72 hours, after which it should be removed.  Wrap patch in a tissue and discard in the trash. Wash hands thoroughly with soap and water. 2. You may remove the patch earlier than 72 hours if you experience unpleasant side effects which may include dry mouth, dizziness or visual disturbances. 3. Avoid touching the patch. Wash your hands with soap and water after contact with the patch.

## 2020-08-31 NOTE — Anesthesia Postprocedure Evaluation (Signed)
Anesthesia Post Note  Patient: Derrick Dixon  Procedure(s) Performed: Removal of hardware right proximal and distal tibia interlocks and distal tibia 4.0 screw (Right Leg Lower)     Patient location during evaluation: PACU Anesthesia Type: MAC Level of consciousness: awake and alert Pain management: pain level controlled Vital Signs Assessment: post-procedure vital signs reviewed and stable Respiratory status: spontaneous breathing, nonlabored ventilation, respiratory function stable and patient connected to nasal cannula oxygen Cardiovascular status: stable and blood pressure returned to baseline Postop Assessment: no apparent nausea or vomiting Anesthetic complications: no   No complications documented.  Last Vitals:  Vitals:   08/31/20 0830 08/31/20 0900  BP: 107/72 115/86  Pulse: 66 62  Resp: 17 16  Temp:  36.5 C  SpO2: 98% 100%    Last Pain:  Vitals:   08/31/20 0900  TempSrc:   PainSc: 4                  Jamail Cullers

## 2020-08-31 NOTE — H&P (Signed)
Derrick Dixon is an 35 y.o. male.   Chief Complaint: right let pain HPI: 35 year old male with a history of a right tibia fracture in early 2021.  He has healed after intramedullary nail treatment.  He has pain at his proximal and distal interlock screws.  He presents today for removal of the deep implants from his right leg both at the knee and at the ankle.  Past Medical History:  Diagnosis Date  . Anxiety   . Rosanna Randy disease     Past Surgical History:  Procedure Laterality Date  . KNEE SURGERY    . ORIF ANKLE FRACTURE  10/05/2019   Procedure: open reduction internal fixation medial malleolus fracture;  Surgeon: Wylene Simmer, MD;  Location: [place];  Service: Orthopedics;;  . SHOULDER SURGERY    . TIBIA IM NAIL INSERTION Right 10/05/2019   Procedure: Intramedullary nailing right tibia;  open reduction internal fixation right ankle posterior malleolus fracture;  Surgeon: Wylene Simmer, MD;  Location: [place];  Service: Orthopedics;  Laterality: Right;  43min    History reviewed. No pertinent family history. Social History:  reports that he has never smoked. He has quit using smokeless tobacco. He reports current alcohol use. He reports that he does not use drugs.  Allergies:  Allergies  Allergen Reactions  . Penicillins Hives         Medications Prior to Admission  Medication Sig Dispense Refill  . Triamcinolone Acetonide (NASACORT ALLERGY 24HR NA) Place into the nose.      Results for orders placed or performed during the hospital encounter of 08/30/20 (from the past 48 hour(s))  SARS CORONAVIRUS 2 (TAT 6-24 HRS) Nasopharyngeal Nasopharyngeal Swab     Status: None   Collection Time: 08/30/20  9:09 AM   Specimen: Nasopharyngeal Swab  Result Value Ref Range   SARS Coronavirus 2 NEGATIVE NEGATIVE    Comment: (NOTE) SARS-CoV-2 target nucleic acids are NOT DETECTED.  The SARS-CoV-2 RNA is generally detectable in upper and lower respiratory  specimens during the acute phase of infection. Negative results do not preclude SARS-CoV-2 infection, do not rule out co-infections with other pathogens, and should not be used as the sole basis for treatment or other patient management decisions. Negative results must be combined with clinical observations, patient history, and epidemiological information. The expected result is Negative.  Fact Sheet for Patients: SugarRoll.be  Fact Sheet for Healthcare Providers: https://www.woods-mathews.com/  This test is not yet approved or cleared by the [place] FDA and  has been authorized for detection and/or diagnosis of SARS-CoV-2 by FDA under an Emergency Use Authorization (EUA). This EUA will remain  in effect (meaning this test can be used) for the duration of the COVID-19 declaration under Se ction 564(b)(1) of the Act, 21 U.S.C. section 360bbb-3(b)(1), unless the authorization is terminated or revoked sooner.  Performed at [facility], [place] [address], [place], [place] [address]    No results found.  Review of Systems no recent fever, chills, nausea, vomiting or changes in his appetite Blood pressure 126/90, pulse 67, temperature 97.8 F (36.6 C), temperature source Oral, resp. rate 16, height 6\' 3"  (1.905 m), weight 78 kg, SpO2 100 %. Physical Exam  Well-nourished well-developed male in no apparent distress.  Alert and oriented x4.  Normal mood and affect.  Gait is normal.  Right leg has healed surgical incisions proximally and distally at the anterior knee and medial ankle.  Slight tenderness to palpation over the palpable screw  heads in both locations.  Skin is otherwise healthy and intact.  Pulses are palpable.  No lymphadenopathy.  5 out of 5 strength in plantarflexion and dorsiflexion of the ankle.   Assessment/Plan Painful hardware right knee and right ankle -to the operating room today for removal of deep implants from both  locations.  The risks and benefits of the alternative treatment options have been discussed in detail.  The patient wishes to proceed with surgery and specifically understands risks of bleeding, infection, nerve damage, blood clots, need for additional surgery, amputation and death.   Wylene Simmer, MD Sep 10, 2020, 7:23 AM

## 2020-08-31 NOTE — Op Note (Signed)
08/31/2020  8:23 AM  PATIENT:  Derrick Dixon  35 y.o. male  PRE-OPERATIVE DIAGNOSIS: 1.  Right knee painful hardware status post tibial nailing      2.  Right ankle painful hardware status post tibial nailing  POST-OPERATIVE DIAGNOSIS: Same  Procedure(s): 1.  Removal of deep implants right knee 2.  Removal of deep implants right ankle 3.  Right knee AP and lateral radiographs 4.  Right ankle AP and lateral radiographs  SURGEON:  Wylene Simmer, MD  ASSISTANT: Mechele Claude, PA-C  ANESTHESIA:   MAC, local  EBL:  minimal   TOURNIQUET: Less than 15 minutes with an ankle [place]  COMPLICATIONS:  None apparent  DISPOSITION:  Extubated, awake and stable to recovery.  INDICATION FOR PROCEDURE: The patient is a 35 year old male who is nearly a year out from intramedullary nailing of the right tibial shaft fracture.  He complains of pain at the knee when kneeling and also at the ankle with certain shoewear.  He has pain at the interlocking screws both proximally and distally.  He has failed nonoperative treatment and presents today for removal of the deep implants from the right knee and from the right ankle.  The risks and benefits of the alternative treatment options have been discussed in detail.  The patient wishes to proceed with surgery and specifically understands risks of bleeding, infection, nerve damage, blood clots, need for additional surgery, amputation and death.  PROCEDURE IN DETAIL:  After pre operative consent was obtained, and the correct operative site was identified, the patient was brought to the operating room and placed supine on the OR table.  Anesthesia was administered.  Pre-operative antibiotics were administered.  A surgical timeout was taken.  The right lower extremity was prepped and draped in standard sterile fashion.  The 2 proximal incisions were identified as well as the 2 distal incisions.  All were anesthetized with half percent Marcaine with epinephrine.  The  lateral knee incision was opened again sharply and dissection carried down through the subcutaneous tissues.  The screw head was identified.  An elevator was used to remove all of the overgrown periosteum.  The screw was then removed in its entirety.  Attention was turned to the medial knee incision where the incision was opened again sharply.  Dissection was carried down through the subcutaneous tissues.  The screw head was identified and cleaned of all periosteum.  The screw was removed without difficulty.  Both proximal knee incisions were irrigated copiously and closed with horizontal mattress sutures of 3-0 nylon.  Attention was directed to the medial aspect of the ankle.  An AP radiograph confirmed the location of both distal screws.  The more proximal incision was opened again sharply.  Dissection was carried down through the subcutaneous tissues.  The periosteum was incised and the screw head exposed.  The screw was removed without difficulty.  The more distal incision was opened again sharply and again dissection carried down to the head of the screw.  Periosteum was incised and elevated exposing the screw head.  The screw was removed without difficulty.  Both wounds were irrigated and closed with nylon.  Sterile dressings were applied followed by compression wraps at the ankle and the knee.  AP and lateral radiograph of the knee showed removal of both of the proximal interlocks in appropriate position of the tibial nail proximally.  AP and lateral radiographs of the ankle show removal of the interlocks from the tibial nail.  The asymptomatic anterior to posterior  lag screw remained per the patient's instructions.  The patient was awakened from anesthesia and transported to the recovery room in stable condition.   FOLLOW UP PLAN: Weightbearing as tolerated on the right lower extremity.  Follow-up in the office in 2 weeks for suture removal.   RADIOGRAPHS: AP and lateral radiographs of the right  knee are obtained intraoperatively.  These show interval removal of 2 proximal interlocking screws from the tibial nail.  No acute injuries are noted.  No degenerative changes are evident at the knee.  AP and lateral radiographs of the right ankle are obtained intraoperatively.  These show interval removal of the 2 distal interlocking screws from the tibial nail.  An anterior to posterior lag screw remains distal to the nail.  Ankle joint shows no degenerative changes.  No acute injuries are noted.    Mechele Claude PA-C was present and scrubbed for the duration of the operative case. His assistance was essential in positioning the patient, prepping and draping, gaining and maintaining exposure, performing the operation, closing and dressing the wounds and applying the splint.

## 2020-09-01 ENCOUNTER — Encounter (HOSPITAL_BASED_OUTPATIENT_CLINIC_OR_DEPARTMENT_OTHER): Payer: Self-pay | Admitting: Orthopedic Surgery

## 2021-02-01 ENCOUNTER — Other Ambulatory Visit: Payer: Self-pay

## 2021-02-02 ENCOUNTER — Ambulatory Visit (INDEPENDENT_AMBULATORY_CARE_PROVIDER_SITE_OTHER): Payer: 59 | Admitting: Family Medicine

## 2021-02-02 ENCOUNTER — Encounter: Payer: Self-pay | Admitting: Family Medicine

## 2021-02-02 VITALS — BP 108/68 | HR 84 | Temp 97.7°F | Ht 75.0 in | Wt 174.0 lb

## 2021-02-02 DIAGNOSIS — H6122 Impacted cerumen, left ear: Secondary | ICD-10-CM

## 2021-02-02 NOTE — Progress Notes (Signed)
Established Patient Office Visit  Subjective:  Patient ID: Derrick Dixon, male    DOB: Sep 09, 1985  Age: 35 y.o. MRN: [id number]  CC:  Chief Complaint  Patient presents with   Tinnitus    Tinnitus in left ear x 1 year becoming worse patient would like ears irrigated.     HPI Derrick Dixon presents for follow-up of cerumen gnosis left ear.  Uses earplugs when he rides his motorcycle.  He does have tinnitus in the left ear.  History of noise exposure when shooting rifles as a younger man.  Denies headache or spinning sensation.  Past Medical History:  Diagnosis Date   Anxiety    Rosanna Randy disease     Past Surgical History:  Procedure Laterality Date   HARDWARE REMOVAL Right 08/31/2020   Procedure: Removal of hardware right proximal and distal tibia interlocks and distal tibia 4.0 screw;  Surgeon: Wylene Simmer, MD;  Location: [place];  Service: Orthopedics;  Laterality: Right;  61mn   KNEE SURGERY     ORIF ANKLE FRACTURE  10/05/2019   Procedure: open reduction internal fixation medial malleolus fracture;  Surgeon: HWylene Simmer MD;  [place]  Service: Orthopedics;;   SHOULDER SURGERY     TIBIA IM NAIL INSERTION Right 10/05/2019   Procedure: Intramedullary nailing right tibia;  open reduction internal fixation right ankle posterior malleolus fracture;  Surgeon: HWylene Simmer MD;  [place] [facility]  Service: Orthopedics;  Laterality: Right;  936m    History reviewed. No pertinent family history.  Social History   Socioeconomic History   Marital status: Single    Spouse name: Not on file   Number of children: Not on file   Years of education: Not on file   Highest education level: Not on file  Occupational History   Not on file  Tobacco Use   Smoking status: Never   Smokeless tobacco: Former  VaScientific laboratory technicianse: Never used  Substance and Sexual Activity   Alcohol use: Yes    Comment: occasionally   Drug  use: No   Sexual activity: Yes  Other Topics Concern   Not on file  Social History Narrative   Not on file   Social Determinants of Health   Financial Resource Strain: Not on file  Food Insecurity: Not on file  Transportation Needs: Not on file  Physical Activity: Not on file  Stress: Not on file  Social Connections: Not on file  Intimate Partner Violence: Not on file    Outpatient Medications Prior to Visit  Medication Sig Dispense Refill   Triamcinolone Acetonide (NASACORT ALLERGY 24HR NA) Place into the nose.     HYDROcodone-acetaminophen (NORCO/VICODIN) 5-325 MG tablet Take 1 tablet by mouth every 6 (six) hours as needed for moderate pain or severe pain. 10 tablet 0   No facility-administered medications prior to visit.    Allergies  Allergen Reactions   Penicillins Hives         ROS Review of Systems  Constitutional:  Negative for chills, diaphoresis, fatigue, fever and unexpected weight change.  HENT:  Positive for hearing loss. Negative for ear discharge and ear pain.   Respiratory: Negative.    Cardiovascular: Negative.   Gastrointestinal: Negative.   Psychiatric/Behavioral: Negative.       Objective:    Physical Exam Vitals and nursing note reviewed.  Constitutional:      General: He is not in acute distress.    Appearance: Normal  appearance. He is normal weight. He is not ill-appearing, toxic-appearing or diaphoretic.  HENT:     Right Ear: Tympanic membrane, ear canal and external ear normal.     Left Ear: There is impacted cerumen.  Eyes:     General: No scleral icterus.       Right eye: No discharge.        Left eye: No discharge.     Conjunctiva/sclera: Conjunctivae normal.  Pulmonary:     Effort: Pulmonary effort is normal.  Skin:    General: Skin is warm and dry.  Neurological:     Mental Status: He is alert and oriented to person, place, and time.  Psychiatric:        Mood and Affect: Mood normal.        Behavior: Behavior normal.      Derrick Dixon is a 35 y.o. male whom I am asked to see for evaluation of diminished hearing in the left ear for the past 6 month. There is a prior history of cerumen impaction. The patient has not been using ear drops to loosen wax immediately prior to this visit. The patient denies ear pain.  The patient's history has been marked as reviewed and updated as appropriate.  Review of Systems Pertinent items noted in HPI and remainder of comprehensive ROS otherwise negative.    Objective:    Auditory canal(s) of the left ear are completely obstructed with cerumen.   Cerumen was removed using gentle irrigation. Tympanic membranes are intact following the procedure.  Auditory canals are normal.    Assessment:    Cerumen Impaction without otitis externa.    Plan:    1. Care instructions given. 2. Home treatment: none. 3. Follow-up as needed.    BP 108/68 (BP Location: Left Arm, Patient Position: Sitting, Cuff Size: Normal)   Pulse 84   Temp 97.7 F (36.5 C) (Temporal)   Ht '6\' 3"'$  (1.905 m)   Wt 174 lb (78.9 kg)   SpO2 97%   BMI 21.75 kg/m  Wt Readings from Last 3 Encounters:  02/02/21 174 lb (78.9 kg)  08/31/20 171 lb 15.3 oz (78 kg)  02/21/20 168 lb (76.2 kg)     Health Maintenance Due  Topic Date Due   Pneumococcal Vaccine 64-33 Years old (1 - PCV) Never done   Hepatitis C Screening  Never done   INFLUENZA VACCINE  01/22/2021    There are no preventive care reminders to display for this patient.  No results found for: TSH Lab Results  Component Value Date   WBC 3.8 (L) 01/18/2020   HGB 15.0 01/18/2020   HCT 44.4 01/18/2020   MCV 88.9 01/18/2020   PLT 263.0 01/18/2020   Lab Results  Component Value Date   NA 136 01/18/2020   K 4.4 01/18/2020   CO2 26 01/18/2020   GLUCOSE 99 01/18/2020   BUN 16 01/18/2020   CREATININE 0.94 01/18/2020   BILITOT 1.6 (H) 01/18/2020   ALKPHOS 78 01/18/2020   AST 25 01/18/2020   ALT 14 01/18/2020   PROT 7.0 01/18/2020    ALBUMIN 4.4 01/18/2020   CALCIUM 9.3 01/18/2020   GFR 92.72 01/18/2020   Lab Results  Component Value Date   CHOL 182 01/18/2020   Lab Results  Component Value Date   HDL 38.80 (L) 01/18/2020   Lab Results  Component Value Date   LDLCALC 122 (H) 01/18/2020   Lab Results  Component Value Date   TRIG 106.0 01/18/2020  Lab Results  Component Value Date   CHOLHDL 5 01/18/2020   No results found for: HGBA1C    Assessment & Plan:   Problem List Items Addressed This Visit       Nervous and Auditory   Excessive cerumen in left ear canal - Primary    No orders of the defined types were placed in this encounter.   Follow-up: Return in about 3 months (around 05/05/2021).   Patient was given information on ear irrigation and cerumen impaction.  Suggested that he use eardrops in place of Q-tips.  He tolerated the procedure well and was relieved with the irrigation. Libby Maw, MD

## 2021-02-15 ENCOUNTER — Other Ambulatory Visit: Payer: Self-pay

## 2021-02-15 ENCOUNTER — Encounter: Payer: Self-pay | Admitting: Family Medicine

## 2021-02-15 ENCOUNTER — Ambulatory Visit (INDEPENDENT_AMBULATORY_CARE_PROVIDER_SITE_OTHER): Payer: 59 | Admitting: Family Medicine

## 2021-02-15 VITALS — BP 102/66 | HR 60 | Temp 97.7°F | Ht 75.0 in | Wt 173.8 lb

## 2021-02-15 DIAGNOSIS — F418 Other specified anxiety disorders: Secondary | ICD-10-CM | POA: Diagnosis not present

## 2021-02-15 DIAGNOSIS — Z0000 Encounter for general adult medical examination without abnormal findings: Secondary | ICD-10-CM

## 2021-02-15 DIAGNOSIS — H6122 Impacted cerumen, left ear: Secondary | ICD-10-CM

## 2021-02-15 LAB — COMPREHENSIVE METABOLIC PANEL
ALT: 13 U/L (ref 0–53)
AST: 17 U/L (ref 0–37)
Albumin: 4.5 g/dL (ref 3.5–5.2)
Alkaline Phosphatase: 68 U/L (ref 39–117)
BUN: 13 mg/dL (ref 6–23)
CO2: 28 mEq/L (ref 19–32)
Calcium: 9.8 mg/dL (ref 8.4–10.5)
Chloride: 103 mEq/L (ref 96–112)
Creatinine, Ser: 1.01 mg/dL (ref 0.40–1.50)
GFR: 97.67 mL/min (ref >60.00)
Glucose, Bld: 80 mg/dL (ref 70–99)
Potassium: 4.6 mEq/L (ref 3.5–5.1)
Sodium: 138 mEq/L (ref 135–145)
Total Bilirubin: 2 mg/dL — ABNORMAL HIGH (ref 0.2–1.2)
Total Protein: 7.4 g/dL (ref 6.0–8.3)

## 2021-02-15 LAB — CBC
HCT: 44.4 % (ref 39.0–52.0)
Hemoglobin: 15 g/dL (ref 13.0–17.0)
MCHC: 33.8 g/dL (ref 30.0–36.0)
MCV: 89.9 fl (ref 78.0–100.0)
Platelets: 292 10*3/uL (ref 150.0–400.0)
RBC: 4.94 Mil/uL (ref 4.22–5.81)
RDW: 12.4 % (ref 11.5–15.5)
WBC: 3.6 10*3/uL — ABNORMAL LOW (ref 4.0–10.5)

## 2021-02-15 LAB — LIPID PANEL
Cholesterol: 184 mg/dL (ref 0–200)
HDL: 41.3 mg/dL (ref >39.00)
LDL Cholesterol: 123 mg/dL — ABNORMAL HIGH (ref 0–99)
NonHDL: 142.95
Total CHOL/HDL Ratio: 4
Triglycerides: 101 mg/dL (ref 0.0–149.0)
VLDL: 20.2 mg/dL (ref 0.0–40.0)

## 2021-02-15 LAB — URINALYSIS, ROUTINE W REFLEX MICROSCOPIC
Bilirubin Urine: NEGATIVE
Hgb urine dipstick: NEGATIVE
Ketones, ur: NEGATIVE
Leukocytes,Ua: NEGATIVE
Nitrite: NEGATIVE
RBC / HPF: NONE SEEN (ref 0–?)
Specific Gravity, Urine: <=1.005 — AB (ref 1.000–1.030)
Total Protein, Urine: NEGATIVE
Urine Glucose: NEGATIVE
Urobilinogen, UA: 0.2 (ref 0.0–1.0)
WBC, UA: NONE SEEN (ref 0–?)
pH: 7 (ref 5.0–8.0)

## 2021-02-15 MED ORDER — VENLAFAXINE HCL ER 75 MG PO CP24: ORAL_CAPSULE | ORAL | 0 refills | Status: DC

## 2021-02-15 NOTE — Progress Notes (Signed)
Established Patient Office Visit  Subjective:  Patient ID: Derrick Dixon, male    DOB: [birth year]  Age: 35 y.o. MRN: [id number]  CC:  Chief Complaint  Patient presents with   Annual Exam    CPE, no concerns. Patient fasting for labs.     HPI Derrick Dixon presents for a complete physical exam.  He is fasting.  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  He exercises by riding trail bikes, doing yoga and going to the gym.  He lost his mother 2 years ago and she was 84.  She had a longstanding history of alcoholism and asphyxiated in her sleep.  He lost his dad 4 years ago from what sounds like an overdose.  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  He has a brother with schizophrenia.  His response to given PHQ-9 was significant.  He has been dealing with sadness and anxiety for some time now.  He had tried counseling in the past but it was not that helpful.  He is interested in trying a medication.  His significant other is concerned.  He is otherwise healthy.  Past Medical History:  Diagnosis Date   Anxiety    Rosanna Randy disease     Past Surgical History:  Procedure Laterality Date   HARDWARE REMOVAL Right 08/31/2020   Procedure: Removal of hardware right proximal and distal tibia interlocks and distal tibia 4.0 screw;  Surgeon: Wylene Simmer, MD;  Location: [place];  Service: Orthopedics;  Laterality: Right;  67mn   KNEE SURGERY     ORIF ANKLE FRACTURE  10/05/2019   Procedure: open reduction internal fixation medial malleolus fracture;  Surgeon: HWylene Simmer MD;  [place]  Service: Orthopedics;;   SHOULDER SURGERY     TIBIA IM NAIL INSERTION Right 10/05/2019   Procedure: Intramedullary nailing right tibia;  open reduction internal fixation right ankle posterior malleolus fracture;  Surgeon: HWylene Simmer MD;  [place] [facility]  Service: Orthopedics;  Laterality: Right;   932m    History reviewed. No pertinent family history.  Social History   Socioeconomic History   Marital status: Single    Spouse name: Not on file   Number of children: Not on file   Years of education: Not on file   Highest education level: Not on file  Occupational History   Not on file  Tobacco Use   Smoking status: Never   Smokeless tobacco: Never  Vaping Use   Vaping Use: Never used  Substance and Sexual Activity   Alcohol use: Yes    Comment: occasionally   Drug use: No   Sexual activity: Yes  Other Topics Concern   Not on file  Social History Narrative   Not on file   Social Determinants of Health   Financial Resource Strain: Not on file  Food Insecurity: Not on file  Transportation Needs: Not on file  Physical Activity: Not on file  Stress: Not on file  Social Connections: Not on file  Intimate Partner Violence: Not on file    Outpatient Medications Prior to Visit  Medication Sig Dispense Refill   Triamcinolone Acetonide (NASACORT ALLERGY 24HR NA) Place into the nose.     No facility-administered medications prior to visit.    Allergies  Allergen Reactions   Penicillin G Rash   Penicillins Hives         ROS Review of Systems  Constitutional: Negative.   HENT: Negative.    Eyes: Negative.   Respiratory: Negative.    Cardiovascular: Negative.   Gastrointestinal: Negative.   Genitourinary: Negative.   Musculoskeletal: Negative.   Neurological: Negative.   Hematological: Negative.   Depression screen [facility] 2/9 02/15/2021 02/15/2021 02/02/2021  Decreased Interest 1 0 0  Down, Depressed, Hopeless 3 1 0  PHQ - 2 Score 4 1 0  Altered sleeping 1 - -  Tired, decreased energy 2 - -  Change in appetite 1 - -  Feeling bad or failure about yourself  3 - -  Trouble concentrating 3 - -  Moving slowly or fidgety/restless 3 - -  Suicidal thoughts 0 - -  PHQ-9 Score 17 - -  Difficult doing work/chores Extremely dIfficult - -    GAD 7 : Generalized  Anxiety Score 02/15/2021 01/11/2020  Nervous, Anxious, on Edge 3 1  Control/stop worrying 2 0  Worry too much - different things 2 1  Trouble relaxing 3 1  Restless 2 0  Easily annoyed or irritable 2 1  Afraid - awful might happen 1 0  Total GAD 7 Score 15 4  Anxiety Difficulty Very difficult Somewhat difficult       Objective:    Physical Exam Vitals and nursing note reviewed.  Constitutional:      General: He is not in acute distress.    Appearance: Normal appearance. He is normal weight. He is not ill-appearing, toxic-appearing or diaphoretic.  HENT:     Head: Normocephalic and atraumatic.     Right Ear: Tympanic membrane, ear canal and external ear normal.     Left Ear: Tympanic membrane, ear canal and external ear normal.     Mouth/Throat:     Mouth: Mucous membranes are moist.     Pharynx: Oropharynx is clear. No oropharyngeal exudate or posterior oropharyngeal erythema.  Eyes:     General:        Right eye: No discharge.        Left eye: No discharge.     Extraocular Movements: Extraocular movements intact.     Conjunctiva/sclera: Conjunctivae normal.     Pupils: Pupils are equal, round, and reactive to light.  Cardiovascular:     Rate and Rhythm: Normal rate and regular rhythm.  Pulmonary:     Effort: Pulmonary effort is normal.     Breath sounds: Normal breath sounds.  Abdominal:     General: Abdomen is flat. Bowel sounds are normal. There is no distension.     Palpations: Abdomen is soft. There is no mass.     Tenderness: There is no abdominal tenderness. There is no guarding or rebound.     Hernia: No hernia is present. There is no hernia in the left inguinal area or right inguinal area.  Genitourinary:    Penis: Uncircumcised. No phimosis, paraphimosis, hypospadias, erythema, tenderness, discharge, swelling or lesions.      Testes:        Right: Mass, tenderness or swelling not present. Right testis is descended.        Left: Mass, tenderness or swelling not  present. Left testis is descended.     Epididymis:     Right: Not inflamed or enlarged.     Left: Not inflamed or enlarged.  Musculoskeletal:     Cervical back: No rigidity or tenderness.  Lymphadenopathy:     Cervical: No cervical adenopathy.     Lower Body: No right inguinal adenopathy. No left inguinal adenopathy.  Skin:    General: Skin is warm and dry.  Neurological:     Mental Status: He is alert and oriented to person, place, and time.    BP 102/66 (BP Location: Right Arm, Patient Position: Sitting, Cuff Size: Normal)   Pulse 60   Temp 97.7 F (36.5 C) (Temporal)   Ht '6\' 3"'$  (1.905 m)   Wt 173 lb 12.8 oz (78.8 kg)   SpO2 97%   BMI 21.72 kg/m  Wt Readings from Last 3 Encounters:  02/15/21 173 lb 12.8 oz (78.8 kg)  02/02/21 174 lb (78.9 kg)  08/31/20 171 lb 15.3 oz (78 kg)     Health Maintenance Due  Topic Date Due   Pneumococcal Vaccine 60-38 Years old (1 - PCV) Never done   Hepatitis C Screening  Never done   INFLUENZA VACCINE  01/22/2021    There are no preventive care reminders to display for this patient.  No results found for: TSH Lab Results  Component Value Date   WBC 3.8 (L) 01/18/2020   HGB 15.0 01/18/2020   HCT 44.4 01/18/2020   MCV 88.9 01/18/2020   PLT 263.0 01/18/2020   Lab Results  Component Value Date   NA 136 01/18/2020   K 4.4 01/18/2020   CO2 26 01/18/2020   GLUCOSE 99 01/18/2020   BUN 16 01/18/2020   CREATININE 0.94 01/18/2020   BILITOT 1.6 (H) 01/18/2020   ALKPHOS 78 01/18/2020   AST 25 01/18/2020   ALT 14 01/18/2020   PROT 7.0 01/18/2020   ALBUMIN 4.4 01/18/2020   CALCIUM 9.3 01/18/2020   GFR 92.72 01/18/2020   Lab Results  Component Value Date   CHOL 182 01/18/2020   Lab Results  Component Value Date   HDL 38.80 (L) 01/18/2020   Lab Results  Component Value Date   LDLCALC 122 (H) 01/18/2020   Lab Results  Component Value Date   TRIG 106.0 01/18/2020   Lab Results  Component Value Date   CHOLHDL 5 01/18/2020    No results found for: HGBA1C    Assessment & Plan:   Problem List Items Addressed This Visit       Nervous and Auditory   Excessive cerumen in left ear canal - Primary     Other   Healthcare maintenance   Relevant Orders   CBC   Comprehensive metabolic panel   Lipid panel   Urinalysis, Routine w reflex microscopic   Depression with anxiety   Relevant Medications   venlafaxine XR (EFFEXOR XR) 75 MG 24 hr capsule    Meds ordered this encounter  Medications   venlafaxine XR (EFFEXOR XR) 75 MG 24 hr capsule    Sig: Take 1 capsule (75 mg total) by mouth daily with breakfast for 10 days, THEN 2 capsules (150 mg total) daily with breakfast.    Dispense:  90 capsule    Refill:  0     Follow-up: Return in about 6 weeks (around 03/29/2021).   Information was given about health maintenance and prevention of disease.  We also discussed at length venlafaxine and reasonable expectations for remission of symptoms.  He was given information on venlafaxine he will let me know if he has any ongoing problems with it.  Otherwise, we will see him back in 6 months Libby Maw, MD

## 2021-05-14 ENCOUNTER — Telehealth: Payer: Self-pay | Admitting: Family Medicine

## 2021-05-14 NOTE — Telephone Encounter (Signed)
Pt called, says he is having a psoriasis flare up ... wondering if he can get cream prescribed.

## 2021-05-14 NOTE — Telephone Encounter (Signed)
Patient needs to come in first available appointment that Dr. Ethelene Hal has for evaluation. Have not seen patient for issue

## 2021-06-07 ENCOUNTER — Ambulatory Visit (INDEPENDENT_AMBULATORY_CARE_PROVIDER_SITE_OTHER): Payer: 59 | Admitting: Family Medicine

## 2021-06-07 ENCOUNTER — Other Ambulatory Visit: Payer: Self-pay

## 2021-06-07 ENCOUNTER — Encounter: Payer: Self-pay | Admitting: Family Medicine

## 2021-06-07 VITALS — BP 110/70 | HR 88 | Temp 97.4°F | Ht 75.0 in | Wt 177.2 lb

## 2021-06-07 DIAGNOSIS — L404 Guttate psoriasis: Secondary | ICD-10-CM | POA: Diagnosis not present

## 2021-06-07 DIAGNOSIS — F341 Dysthymic disorder: Secondary | ICD-10-CM | POA: Diagnosis not present

## 2021-06-07 MED ORDER — CALCIPOTRIENE 0.005 % EX CREA: TOPICAL_CREAM | Freq: Two times a day (BID) | CUTANEOUS | 3 refills | Status: DC

## 2021-06-07 NOTE — Progress Notes (Signed)
Established Patient Office Visit  Subjective:  Patient ID: Derrick Dixon, male    DOB: 1985-08-29  Age: 35 y.o. MRN: [id number]  CC:  Chief Complaint  Patient presents with   Psoriasis    Pt c/o psoriasis flare up on both arms and chest for the last 4-5 months. OTC ointment not working.     HPI Derrick Dixon presents for follow-up of dysthymia/depression and skin rash with a history of psoriasis.  Patient took the Effexor a few days and then discontinued it.  He found it to be too stimulating.  His life circumstances have since improved.  He is exercising and being mindful of his diet and sleep patterns.  He is feeling better and does not feel as though he needs an antidepressant at this time.  Longstanding history of psoriasis.  It bothers him from time to time.  He has treated it with topicals including 1 from [place].  These do not seem to be helping as much.  He has never had formal therapy for this.  Rash is mildly pruritic.  Is located mostly on his arms and upper chest.  It is recurrent.  Scalp face and private areas are not affected.  Denies joint pains.  Past Medical History:  Diagnosis Date   Anxiety    Rosanna Randy disease     Past Surgical History:  Procedure Laterality Date   HARDWARE REMOVAL Right 08/31/2020   Procedure: Removal of hardware right proximal and distal tibia interlocks and distal tibia 4.0 screw;  Surgeon: Wylene Simmer, MD;  Location: [place];  Service: Orthopedics;  Laterality: Right;  66min   KNEE SURGERY     ORIF ANKLE FRACTURE  10/05/2019   Procedure: open reduction internal fixation medial malleolus fracture;  Surgeon: Wylene Simmer, MD;  Location: [place];  Service: Orthopedics;;   SHOULDER SURGERY     TIBIA IM NAIL INSERTION Right 10/05/2019   Procedure: Intramedullary nailing right tibia;  open reduction internal fixation right ankle posterior malleolus fracture;  Surgeon: Wylene Simmer, MD;  Location: [place];   Service: Orthopedics;  Laterality: Right;  15min    History reviewed. No pertinent family history.  Social History   Socioeconomic History   Marital status: Single    Spouse name: Not on file   Number of children: Not on file   Years of education: Not on file   Highest education level: Not on file  Occupational History   Not on file  Tobacco Use   Smoking status: Never   Smokeless tobacco: Never  Vaping Use   Vaping Use: Never used  Substance and Sexual Activity   Alcohol use: Yes    Comment: occasionally   Drug use: No   Sexual activity: Yes  Other Topics Concern   Not on file  Social History Narrative   Not on file   Social Determinants of Health   Financial Resource Strain: Not on file  Food Insecurity: Not on file  Transportation Needs: Not on file  Physical Activity: Not on file  Stress: Not on file  Social Connections: Not on file  Intimate Partner Violence: Not on file    Outpatient Medications Prior to Visit  Medication Sig Dispense Refill   Triamcinolone Acetonide (NASACORT ALLERGY 24HR NA) Place into the nose.     venlafaxine XR (EFFEXOR XR) 75 MG 24 hr capsule Take 1 capsule (75 mg total) by mouth daily with breakfast for 10 days, THEN 2 capsules (150 mg  total) daily with breakfast. 90 capsule 0   No facility-administered medications prior to visit.    Allergies  Allergen Reactions   Penicillin G Rash   Penicillins Hives         ROS Review of Systems  Constitutional:  Negative for chills, diaphoresis, fatigue, fever and unexpected weight change.  HENT: Negative.    Eyes:  Negative for photophobia.  Respiratory: Negative.    Cardiovascular: Negative.   Gastrointestinal: Negative.   Musculoskeletal:  Negative for arthralgias and myalgias.  Skin:  Positive for color change and rash.  Neurological:  Negative for speech difficulty and weakness.  Depression screen [facility] 2/9 06/07/2021 02/15/2021 02/15/2021  Decreased Interest 1 1 0  Down,  Depressed, Hopeless 1 3 1   PHQ - 2 Score 2 4 1   Altered sleeping 1 1 -  Tired, decreased energy 1 2 -  Change in appetite 0 1 -  Feeling bad or failure about yourself  0 3 -  Trouble concentrating 0 3 -  Moving slowly or fidgety/restless 0 3 -  Suicidal thoughts 0 0 -  PHQ-9 Score 4 17 -  Difficult doing work/chores Somewhat difficult Extremely dIfficult -       Objective:    Physical Exam Vitals and nursing note reviewed.  Constitutional:      General: He is not in acute distress.    Appearance: Normal appearance. He is not ill-appearing, toxic-appearing or diaphoretic.  HENT:     Head: Normocephalic and atraumatic.     Right Ear: External ear normal.     Left Ear: External ear normal.  Eyes:     General:        Right eye: No discharge.        Left eye: No discharge.     Conjunctiva/sclera: Conjunctivae normal.  Pulmonary:     Effort: Pulmonary effort is normal.  Skin:    General: Skin is warm and dry.     Comments: On the extensor lateral surface of the forearms are 1 to 3 cm circular patches made up of fine erythematous papules that seem to be coalescing in places.  There is no silvery scale.  Similar lesions on the anterior upper trunk.  Neurological:     Mental Status: He is alert and oriented to person, place, and time.  Psychiatric:        Mood and Affect: Mood normal.        Behavior: Behavior normal.    BP 110/70 (BP Location: Right Arm, Patient Position: Sitting, Cuff Size: Normal)    Pulse 88    Temp (!) 97.4 F (36.3 C) (Temporal)    Ht 6\' 3"  (1.905 m)    Wt 177 lb 3.7 oz (80.4 kg)    SpO2 96%    BMI 22.15 kg/m  Wt Readings from Last 3 Encounters:  06/07/21 177 lb 3.7 oz (80.4 kg)  02/15/21 173 lb 12.8 oz (78.8 kg)  02/02/21 174 lb (78.9 kg)     Health Maintenance Due  Topic Date Due   Pneumococcal Vaccine 69-40 Years old (1 - PCV) Never done   Hepatitis C Screening  Never done   INFLUENZA VACCINE  Never done    There are no preventive care  reminders to display for this patient.  No results found for: TSH Lab Results  Component Value Date   WBC 3.6 (L) 02/15/2021   HGB 15.0 02/15/2021   HCT 44.4 02/15/2021   MCV 89.9 02/15/2021   PLT 292.0 02/15/2021  Lab Results  Component Value Date   NA 138 02/15/2021   K 4.6 02/15/2021   CO2 28 02/15/2021   GLUCOSE 80 02/15/2021   BUN 13 02/15/2021   CREATININE 1.01 02/15/2021   BILITOT 2.0 (H) 02/15/2021   ALKPHOS 68 02/15/2021   AST 17 02/15/2021   ALT 13 02/15/2021   PROT 7.4 02/15/2021   ALBUMIN 4.5 02/15/2021   CALCIUM 9.8 02/15/2021   GFR 97.67 02/15/2021   Lab Results  Component Value Date   CHOL 184 02/15/2021   Lab Results  Component Value Date   HDL 41.30 02/15/2021   Lab Results  Component Value Date   LDLCALC 123 (H) 02/15/2021   Lab Results  Component Value Date   TRIG 101.0 02/15/2021   Lab Results  Component Value Date   CHOLHDL 4 02/15/2021   No results found for: HGBA1C    Assessment & Plan:   Problem List Items Addressed This Visit       Musculoskeletal and Integument   Psoriasis, guttate - Primary     Other   Dysthymia    Meds ordered this encounter  Medications   calcipotriene (DOVONEX) 0.005 % cream    Sig: Apply topically 2 (two) times daily.    Dispense:  60 g    Refill:  3    Follow-up: Return in about 6 months (around 12/06/2021).  Given information on mindfulness based stress reduction.  We will forego treatment at this time.  He will return as his symptoms dictate.  We will try Dovonex cream twice daily for his lesions.  Will consider Derm referral for light therapy if worse.  Libby Maw, MD

## 2021-11-29 ENCOUNTER — Encounter: Payer: Self-pay | Admitting: Podiatry

## 2021-11-29 ENCOUNTER — Ambulatory Visit (INDEPENDENT_AMBULATORY_CARE_PROVIDER_SITE_OTHER): Payer: 59

## 2021-11-29 ENCOUNTER — Ambulatory Visit (INDEPENDENT_AMBULATORY_CARE_PROVIDER_SITE_OTHER): Payer: 59 | Admitting: Podiatry

## 2021-11-29 DIAGNOSIS — Q666 Other congenital valgus deformities of feet: Secondary | ICD-10-CM | POA: Diagnosis not present

## 2021-11-29 DIAGNOSIS — M7750 Other enthesopathy of unspecified foot: Secondary | ICD-10-CM

## 2021-11-29 DIAGNOSIS — M778 Other enthesopathies, not elsewhere classified: Secondary | ICD-10-CM

## 2021-11-29 NOTE — Progress Notes (Signed)
  Subjective:  Patient ID: Derrick Dixon, male    DOB: [birth year],  MRN: [id number] HPI Chief Complaint  Patient presents with   Leg Pain    Lateral lower extremity bilateral - suspects fibular stress fracture, seen ortho-xrayed-negative, used to be active runner/exercise, increased activity aggravates, so has decreased   New Patient (Initial Visit)    36 y.o. male presents with the above complaint.   ROS: Denies fever chills nausea vomiting muscle aches pains calf pain back pain chest pain shortness of breath.  Past Medical History:  Diagnosis Date   Anxiety    Rosanna Randy disease    Past Surgical History:  Procedure Laterality Date   HARDWARE REMOVAL Right 08/31/2020   Procedure: Removal of hardware right proximal and distal tibia interlocks and distal tibia 4.0 screw;  Surgeon: Wylene Simmer, MD;  Location: [place];  Service: Orthopedics;  Laterality: Right;  92mn   KNEE SURGERY     ORIF ANKLE FRACTURE  10/05/2019   Procedure: open reduction internal fixation medial malleolus fracture;  Surgeon: HWylene Simmer MD;  [place]  Service: Orthopedics;;   SHOULDER SURGERY     TIBIA IM NAIL INSERTION Right 10/05/2019   Procedure: Intramedullary nailing right tibia;  open reduction internal fixation right ankle posterior malleolus fracture;  Surgeon: HWylene Simmer MD;  [place]  Service: Orthopedics;  Laterality: Right;  964m    Current Outpatient Medications:    Triamcinolone Acetonide (NASACORT ALLERGY 24HR NA), Place into the nose., Disp: , Rfl:   Allergies  Allergen Reactions   Penicillin G Rash   Penicillins Hives        Review of Systems Objective:  There were no vitals filed for this visit.  General: Well developed, nourished, in no acute distress, alert and oriented x3   Dermatological: Skin is warm, dry and supple bilateral. Nails x 10 are well maintained; remaining integument appears unremarkable at  this time. There are no open sores, no preulcerative lesions, no rash or signs of infection present.  Vascular: Dorsalis Pedis artery and Posterior Tibial artery pedal pulses are 2/4 bilateral with immedate capillary fill time. Pedal hair growth present. No varicosities and no lower extremity edema present bilateral.   Neruologic: Grossly intact via light touch bilateral. Vibratory intact via tuning fork bilateral. Protective threshold with Semmes Wienstein monofilament intact to all pedal sites bilateral. Patellar and Achilles deep tendon reflexes 2+ bilateral. No Babinski or clonus noted bilateral.   Musculoskeletal: No gross boney pedal deformities bilateral. No pain, crepitus, or limitation noted with foot and ankle range of motion bilateral. Muscular strength 5/5 in all groups tested bilateral.  No reproducible pain on palpation flexible pes planus is noted bilateral.  Gait: Unassisted, Nonantalgic.    Radiographs:  Radiographs taken today demonstrate pes planovalgus no coalitions visible but he does have a posterior malleolar screw retained to the tibia also has IM nail with no screws in the distal portion of the nail.  The fibula bilaterally appears to be normal.  No significant abnormalities other than mild pes planus.  Assessment & Plan:   Assessment: Most likely muscle strain at the myotendinous junction of the peroneal tendons with pes planovalgus bilateral.  Plan: We discussed topical anti-inflammatory such as Voltaren and we will going get him set up with Derrick Dixon a neutral set of orthotics for his flat feet.     Derrick Dixon T. [place]

## 2021-12-26 ENCOUNTER — Ambulatory Visit (INDEPENDENT_AMBULATORY_CARE_PROVIDER_SITE_OTHER): Payer: 59 | Admitting: Podiatry

## 2021-12-26 DIAGNOSIS — M7750 Other enthesopathy of unspecified foot: Secondary | ICD-10-CM

## 2021-12-26 DIAGNOSIS — Q666 Other congenital valgus deformities of feet: Secondary | ICD-10-CM

## 2021-12-26 NOTE — Progress Notes (Signed)
Patient presents today for orthotic pick up and patient voices no new complaints  Orthotics were fitted to patient's feet and no discomfort and no rubbing. Patient satisfied with the orthotics  Orthotics were dispensed to patient with instructions for break in wear and to call the office with any concerns or questions

## 2022-04-04 IMAGING — RF DG TIBIA/FIBULA 2V*R*
1 series · 13 of 13 positions shown · IV contrast (agent unspecified)
Comparison: None.

CLINICAL DATA: Intramedullary nail fixation of the right tibia

EXAM:
DG C-ARM 1-60 MIN; RIGHT TIBIA AND FIBULA - 2 VIEW
CONTRAST:  None.
FLUOROSCOPY TIME:  Fluoroscopy Time:  Not reported.
Number of Acquired Spot Images: 13

[Series 1: run · 13 of 13 slices shown]
[im 1/13]
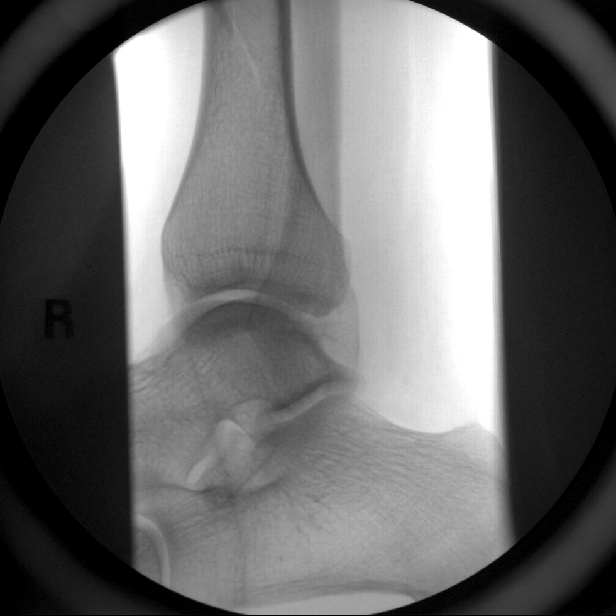
[im 2/13]
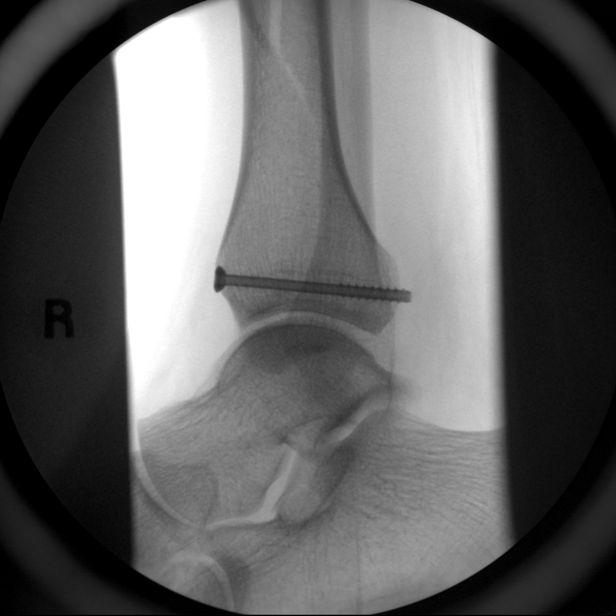
[im 3/13]
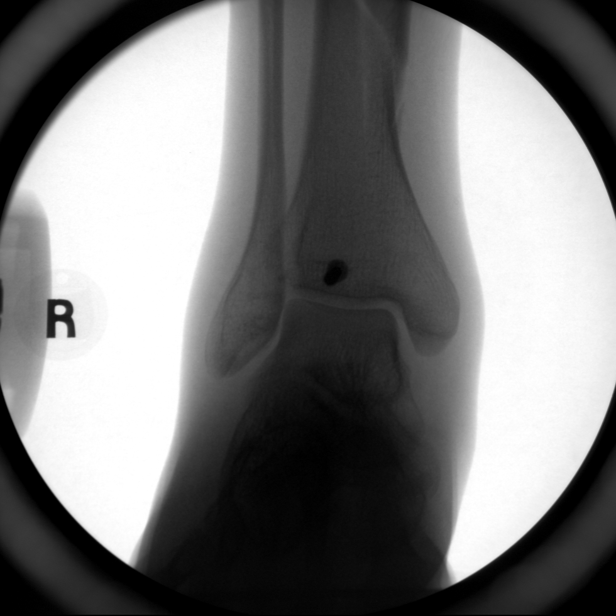
[im 4/13]
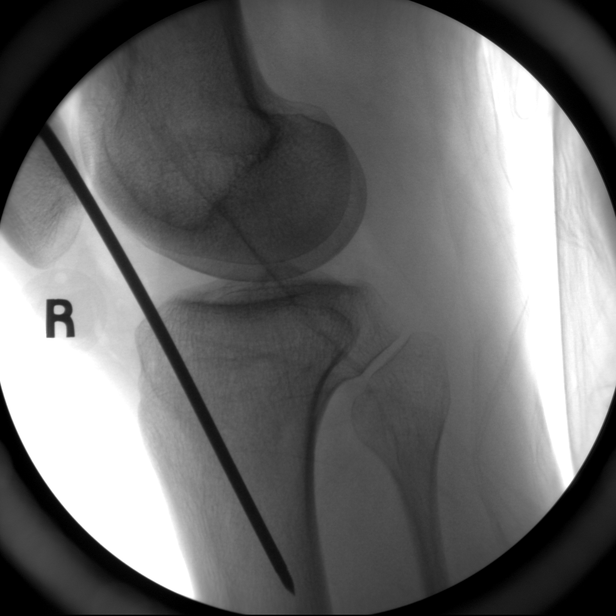
[im 5/13]
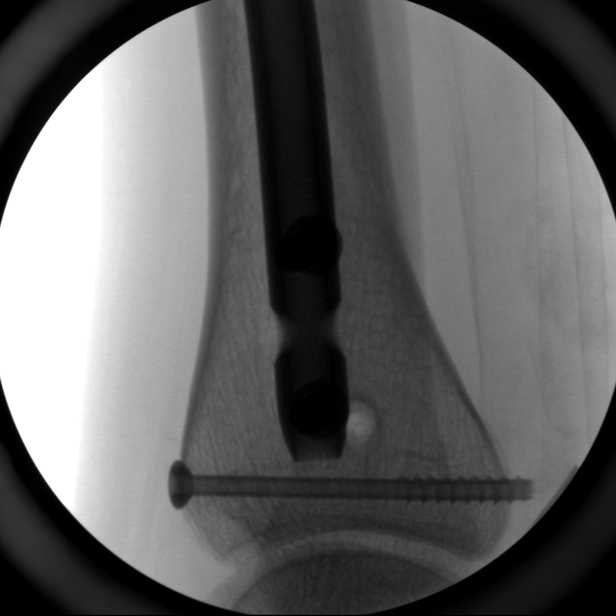
[im 6/13]
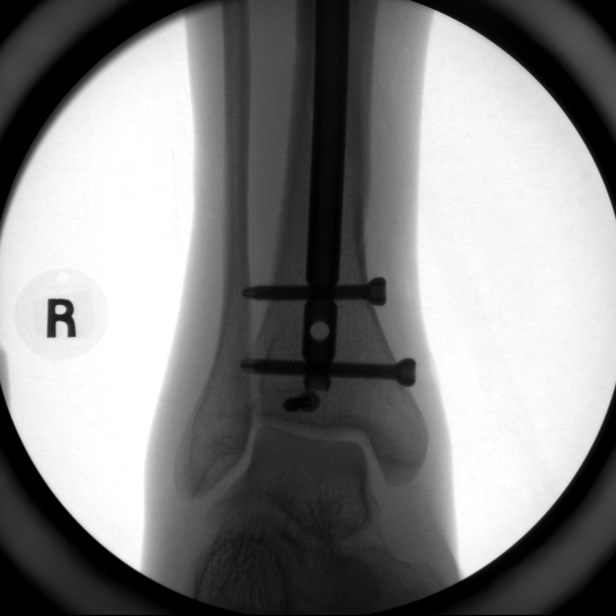
[im 7/13]
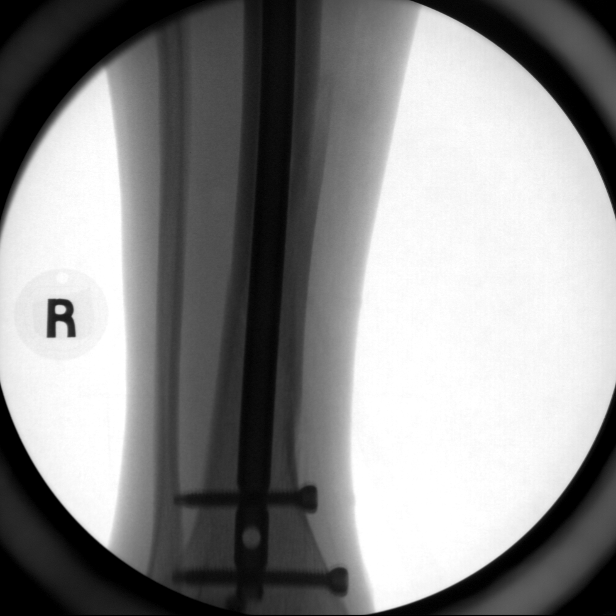
[im 8/13]
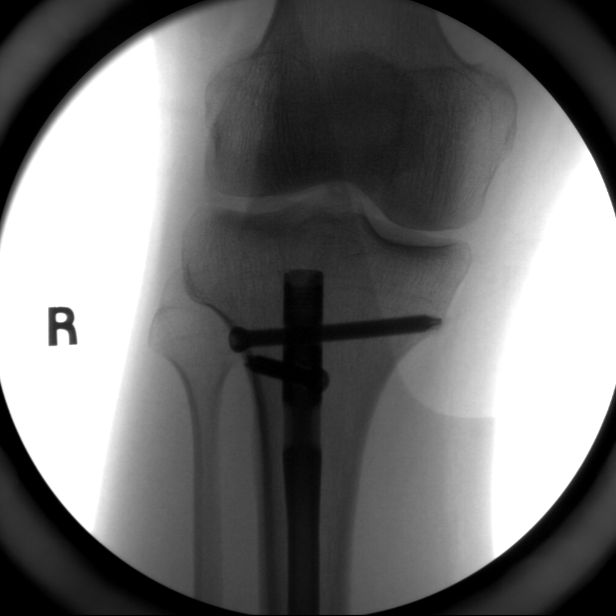
[im 9/13]
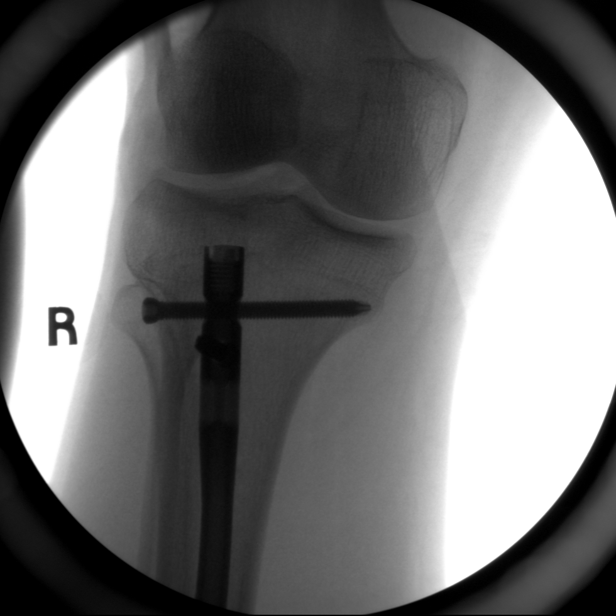
[im 10/13]
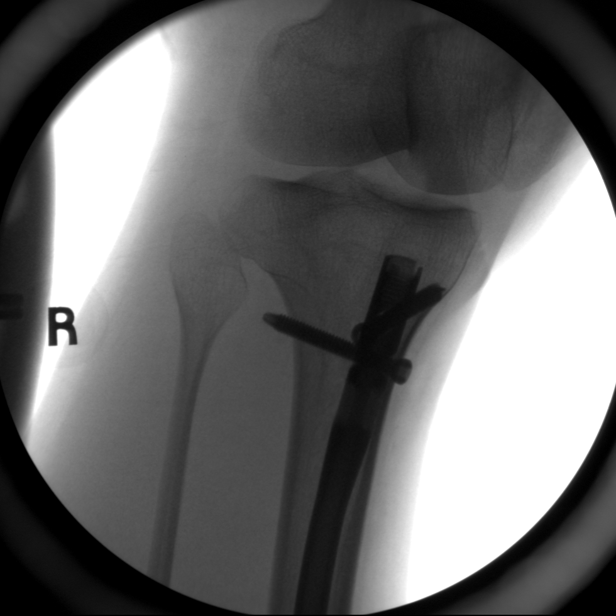
[im 11/13]
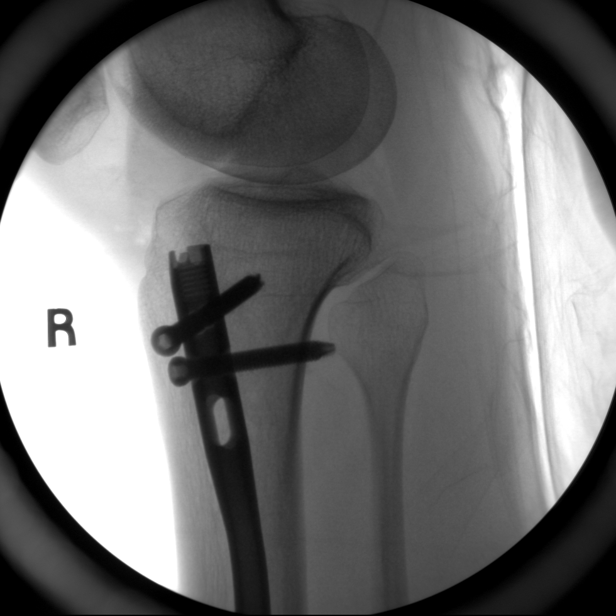
[im 12/13]
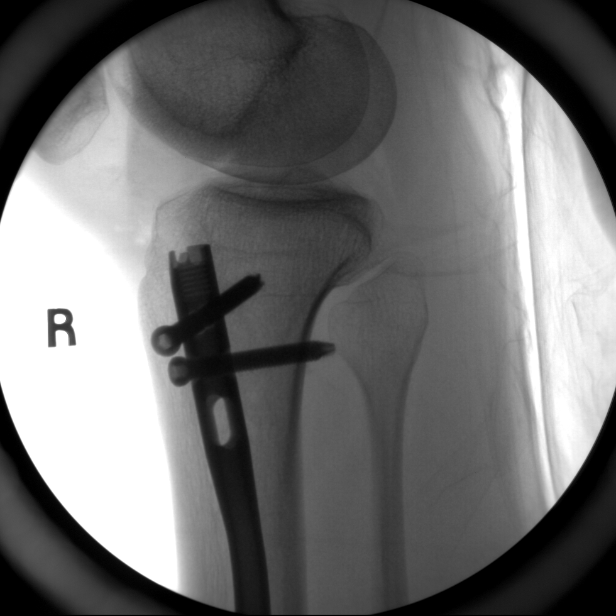
[im 13/13]
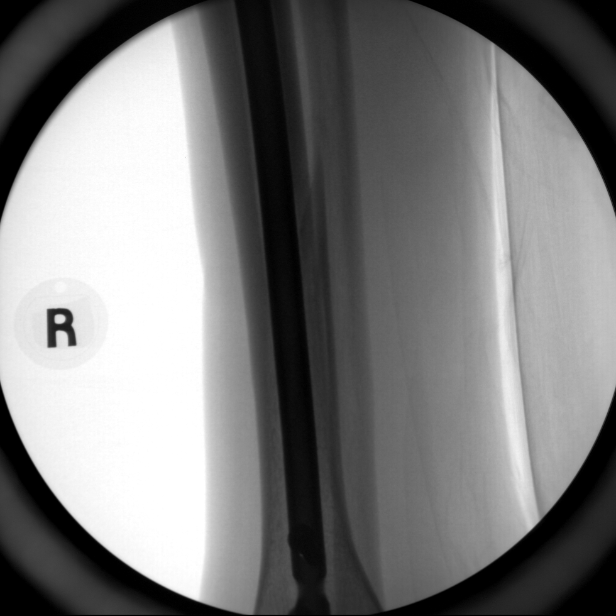

[13 of 13 positions shown; findings below may reference images not displayed]

FINDINGS: Intraoperative fluoroscopic views of the right tibia and fibula are
submitted for review, demonstrating intramedullary nail fixation of
a spiral type fracture of the tibial diaphysis.
IMPRESSION: Intraoperative fluoroscopic views of the right tibia and fibula are
submitted for review, demonstrating intramedullary nail fixation of
a spiral type fracture of the tibial diaphysis.

## 2022-06-05 ENCOUNTER — Ambulatory Visit (INDEPENDENT_AMBULATORY_CARE_PROVIDER_SITE_OTHER): Payer: Commercial Managed Care - HMO | Admitting: Family Medicine

## 2022-06-05 ENCOUNTER — Encounter: Payer: Self-pay | Admitting: Family Medicine

## 2022-06-05 VITALS — BP 122/78 | HR 85 | Temp 98.9°F | Ht 75.0 in | Wt 174.2 lb

## 2022-06-05 DIAGNOSIS — J029 Acute pharyngitis, unspecified: Secondary | ICD-10-CM

## 2022-06-05 DIAGNOSIS — B349 Viral infection, unspecified: Secondary | ICD-10-CM

## 2022-06-05 LAB — POCT RAPID STREP A (OFFICE): Rapid Strep A Screen: NEGATIVE

## 2022-06-05 NOTE — Progress Notes (Signed)
Established Patient Office Visit   Subjective:  Patient ID: Derrick Dixon, male    DOB: 15-Jul-1985  Age: 36 y.o. MRN: [id number]  Chief Complaint  Patient presents with   Cough    Cough, body chills, sore throat fever come and go x 4 days.     Cough Associated symptoms include myalgias and a sore throat. Pertinent negatives include no ear pain, eye redness, headaches, rash, shortness of breath or wheezing.   Encounter Diagnoses  Name Primary?   Viral syndrome Yes   Pharyngitis, unspecified etiology    Presents with a 4 to 5-day history of nasal and sinus congestion, postnasal drip, sore throat, cough, myalgias and arthralgias.  Has been decreased but denies nausea or vomiting.  No headache.  Denies wheezing or difficulty breathing.  Temperature has been elevated at home.  Had COVID 3 months ago.   Review of Systems  Constitutional: Negative.   HENT:  Positive for congestion, sinus pain and sore throat. Negative for ear pain.   Eyes:  Negative for blurred vision, discharge and redness.  Respiratory:  Positive for cough. Negative for shortness of breath and wheezing.   Cardiovascular: Negative.   Gastrointestinal:  Negative for abdominal pain, nausea and vomiting.  Genitourinary: Negative.   Musculoskeletal:  Positive for joint pain and myalgias.  Skin:  Negative for rash.  Neurological:  Negative for tingling, loss of consciousness, weakness and headaches.  Endo/Heme/Allergies:  Negative for polydipsia.     Current Outpatient Medications:    Triamcinolone Acetonide (NASACORT ALLERGY 24HR NA), Place into the nose., Disp: , Rfl:    Objective:     BP 122/78 (BP Location: Right Arm, Patient Position: Sitting, Cuff Size: Normal)   Pulse 85   Temp 98.9 F (37.2 C) (Temporal)   Ht '6\' 3"'$  (1.905 m)   Wt 174 lb 3.2 oz (79 kg)   SpO2 98%   BMI 21.77 kg/m    Physical Exam Constitutional:      General: He is not in acute distress.    Appearance: Normal appearance. He is not  ill-appearing, toxic-appearing or diaphoretic.  HENT:     Head: Normocephalic and atraumatic.     Right Ear: External ear normal.     Left Ear: External ear normal.     Mouth/Throat:     Mouth: Mucous membranes are moist.     Pharynx: Oropharynx is clear. Posterior oropharyngeal erythema present. No oropharyngeal exudate.  Eyes:     General: No scleral icterus.       Right eye: No discharge.        Left eye: No discharge.     Extraocular Movements: Extraocular movements intact.     Conjunctiva/sclera: Conjunctivae normal.     Pupils: Pupils are equal, round, and reactive to light.  Cardiovascular:     Rate and Rhythm: Normal rate and regular rhythm.  Pulmonary:     Effort: Pulmonary effort is normal. No respiratory distress.     Breath sounds: Normal breath sounds. No wheezing or rales.  Musculoskeletal:     Cervical back: No rigidity or tenderness.  Lymphadenopathy:     Cervical: No cervical adenopathy.  Skin:    General: Skin is warm and dry.  Neurological:     Mental Status: He is alert and oriented to person, place, and time.  Psychiatric:        Mood and Affect: Mood normal.        Behavior: Behavior normal.      No  results found for any visits on 06/05/22.    The ASCVD Risk score (Arnett DK, et al., 2019) failed to calculate for the following reasons:   The 2019 ASCVD risk score is only valid for ages 63 to 47    Assessment & Plan:   Viral syndrome -     COVID-19, Flu A+B and RSV  Pharyngitis, unspecified etiology -     POCT rapid strep A    Return if symptoms worsen or fail to improve.  Return in 1 week if not improving.  Information was given on viral illnesses.  Libby Maw, MD

## 2022-06-07 LAB — COVID-19, FLU A+B AND RSV
Influenza A, NAA: NOT DETECTED
Influenza B, NAA: DETECTED — AB
RSV, NAA: NOT DETECTED
SARS-CoV-2, NAA: NOT DETECTED

## 2023-01-20 ENCOUNTER — Telehealth: Payer: Self-pay | Admitting: Family Medicine

## 2023-01-20 NOTE — Telephone Encounter (Signed)
Pt has broken his thumb this weekend and would like a recommendation on where to go for this. Pt at [phone]

## 2023-02-20 HISTORY — PX: HAND SURGERY: SHX662

## 2023-06-26 ENCOUNTER — Encounter: Payer: Self-pay | Admitting: Family Medicine

## 2023-06-26 ENCOUNTER — Ambulatory Visit (INDEPENDENT_AMBULATORY_CARE_PROVIDER_SITE_OTHER): Payer: No Typology Code available for payment source | Admitting: Family Medicine

## 2023-06-26 VITALS — BP 118/72 | HR 82 | Temp 97.8°F | Ht 75.0 in | Wt 178.6 lb

## 2023-06-26 DIAGNOSIS — Z1159 Encounter for screening for other viral diseases: Secondary | ICD-10-CM

## 2023-06-26 DIAGNOSIS — R002 Palpitations: Secondary | ICD-10-CM

## 2023-06-26 DIAGNOSIS — Z0001 Encounter for general adult medical examination with abnormal findings: Secondary | ICD-10-CM

## 2023-06-26 DIAGNOSIS — R6882 Decreased libido: Secondary | ICD-10-CM

## 2023-06-26 DIAGNOSIS — Z0000 Encounter for general adult medical examination without abnormal findings: Secondary | ICD-10-CM

## 2023-06-26 DIAGNOSIS — Z114 Encounter for screening for human immunodeficiency virus [HIV]: Secondary | ICD-10-CM

## 2023-06-26 DIAGNOSIS — Z13220 Encounter for screening for lipoid disorders: Secondary | ICD-10-CM | POA: Diagnosis not present

## 2023-06-26 DIAGNOSIS — Z131 Encounter for screening for diabetes mellitus: Secondary | ICD-10-CM | POA: Diagnosis not present

## 2023-06-26 LAB — URINALYSIS, ROUTINE W REFLEX MICROSCOPIC
Bilirubin Urine: NEGATIVE
Hgb urine dipstick: NEGATIVE
Ketones, ur: NEGATIVE
Leukocytes,Ua: NEGATIVE
Nitrite: NEGATIVE
RBC / HPF: NONE SEEN (ref 0–?)
Specific Gravity, Urine: 1.015 (ref 1.000–1.030)
Total Protein, Urine: NEGATIVE
Urine Glucose: NEGATIVE
Urobilinogen, UA: 0.2 (ref 0.0–1.0)
pH: 6.5 (ref 5.0–8.0)

## 2023-06-26 LAB — COMPREHENSIVE METABOLIC PANEL
ALT: 14 U/L (ref 0–53)
AST: 15 U/L (ref 0–37)
Albumin: 4.7 g/dL (ref 3.5–5.2)
Alkaline Phosphatase: 58 U/L (ref 39–117)
BUN: 15 mg/dL (ref 6–23)
CO2: 25 meq/L (ref 19–32)
Calcium: 9.4 mg/dL (ref 8.4–10.5)
Chloride: 101 meq/L (ref 96–112)
Creatinine, Ser: 0.94 mg/dL (ref 0.40–1.50)
GFR: 104.72 mL/min (ref >60.00)
Glucose, Bld: 98 mg/dL (ref 70–99)
Potassium: 4.3 meq/L (ref 3.5–5.1)
Sodium: 136 meq/L (ref 135–145)
Total Bilirubin: 1.6 mg/dL — ABNORMAL HIGH (ref 0.2–1.2)
Total Protein: 7.2 g/dL (ref 6.0–8.3)

## 2023-06-26 LAB — LIPID PANEL
Cholesterol: 209 mg/dL — ABNORMAL HIGH (ref 0–200)
HDL: 42.8 mg/dL (ref >39.00)
LDL Cholesterol: 147 mg/dL — ABNORMAL HIGH (ref 0–99)
NonHDL: 166.61
Total CHOL/HDL Ratio: 5
Triglycerides: 100 mg/dL (ref 0.0–149.0)
VLDL: 20 mg/dL (ref 0.0–40.0)

## 2023-06-26 LAB — HEMOGLOBIN A1C: Hgb A1c MFr Bld: 4.9 % (ref 4.6–6.5)

## 2023-06-26 LAB — CBC WITH DIFFERENTIAL/PLATELET
Basophils Absolute: 0 10*3/uL (ref 0.0–0.1)
Basophils Relative: 0.7 % (ref 0.0–3.0)
Eosinophils Absolute: 0.1 10*3/uL (ref 0.0–0.7)
Eosinophils Relative: 3.2 % (ref 0.0–5.0)
HCT: 45.3 % (ref 39.0–52.0)
Hemoglobin: 15.4 g/dL (ref 13.0–17.0)
Lymphocytes Relative: 33.4 % (ref 12.0–46.0)
Lymphs Abs: 1.5 10*3/uL (ref 0.7–4.0)
MCHC: 33.9 g/dL (ref 30.0–36.0)
MCV: 89.8 fL (ref 78.0–100.0)
Monocytes Absolute: 0.3 10*3/uL (ref 0.1–1.0)
Monocytes Relative: 7.4 % (ref 3.0–12.0)
Neutro Abs: 2.4 10*3/uL (ref 1.4–7.7)
Neutrophils Relative %: 55.3 % (ref 43.0–77.0)
Platelets: 272 10*3/uL (ref 150.0–400.0)
RBC: 5.04 Mil/uL (ref 4.22–5.81)
RDW: 12.3 % (ref 11.5–15.5)
WBC: 4.4 10*3/uL (ref 4.0–10.5)

## 2023-06-26 LAB — TSH: TSH: 1.04 u[IU]/mL (ref 0.35–5.50)

## 2023-06-26 NOTE — Progress Notes (Signed)
 Established Patient Office Visit   Subjective:  Patient ID: Derrick Dixon, male    DOB: 1986/02/20  Age: 38 y.o. MRN: [id number]  Chief Complaint  Patient presents with   Annual Exam    Cpe. Pt is fasting. Pt has not concerns doing well.     HPI Encounter Diagnoses  Name Primary?   Healthcare maintenance Yes   Screening for cholesterol level    Screening for diabetes mellitus    Palpitation    Libido, decreased    Screening for HIV (human immunodeficiency virus)    Encounter for hepatitis C screening test for low risk patient    Here today for his annual physical.  Doing relatively well.  He lives at home with his girlfriend and their dog and cat.  He is active physically on a regular basis and has regular dental care.  He had been training intensely for a mountain biking competition and had experienced brief episodes of irregular heartbeat.  These episodes were asymptomatic without chest pain or shortness of breath.  They resolved on their own.  Cutting back on caffeine intake seem to help.  He rarely drinks alcohol.  He does not use illicit drugs.  He has noticed a decrease in his libido and energy levels.   Review of Systems  Constitutional: Negative.   HENT: Negative.    Eyes:  Negative for blurred vision, discharge and redness.  Respiratory: Negative.  Negative for shortness of breath.   Cardiovascular:  Positive for palpitations. Negative for chest pain.  Gastrointestinal:  Negative for abdominal pain.  Genitourinary: Negative.   Musculoskeletal: Negative.  Negative for myalgias.  Skin:  Negative for rash.  Neurological:  Negative for tingling, loss of consciousness and weakness.  Endo/Heme/Allergies:  Negative for polydipsia.      06/26/2023    9:00 AM 06/05/2022   11:17 AM 06/07/2021   10:05 AM  Depression screen PHQ 2/9  Decreased Interest 0 0 1  Down, Depressed, Hopeless 0 0 1  PHQ - 2 Score 0 0 2  Altered sleeping 1  1  Tired, decreased energy 0  1  Change in  appetite 0  0  Feeling bad or failure about yourself  1  0  Trouble concentrating 1  0  Moving slowly or fidgety/restless 0  0  Suicidal thoughts 0  0  PHQ-9 Score 3  4  Difficult doing work/chores Somewhat difficult  Somewhat difficult       Current Outpatient Medications:    Triamcinolone Acetonide (NASACORT ALLERGY 24HR NA), Place into the nose., Disp: , Rfl:    Objective:     BP 118/72   Pulse 82   Temp 97.8 F (36.6 C)   Ht 6' 3 (1.905 m)   Wt 178 lb 9.6 oz (81 kg)   SpO2 98%   BMI 22.32 kg/m  Wt Readings from Last 3 Encounters:  06/26/23 178 lb 9.6 oz (81 kg)  06/05/22 174 lb 3.2 oz (79 kg)  06/07/21 177 lb 3.7 oz (80.4 kg)      Physical Exam Constitutional:      General: He is not in acute distress.    Appearance: Normal appearance. He is not ill-appearing, toxic-appearing or diaphoretic.  HENT:     Head: Normocephalic and atraumatic.     Right Ear: Tympanic membrane, ear canal and external ear normal.     Left Ear: Tympanic membrane, ear canal and external ear normal.     Mouth/Throat:     Mouth:  Mucous membranes are moist.     Pharynx: Oropharynx is clear. No oropharyngeal exudate or posterior oropharyngeal erythema.  Eyes:     General: No scleral icterus.       Right eye: No discharge.        Left eye: No discharge.     Extraocular Movements: Extraocular movements intact.     Conjunctiva/sclera: Conjunctivae normal.     Pupils: Pupils are equal, round, and reactive to light.  Cardiovascular:     Rate and Rhythm: Normal rate and regular rhythm.  Pulmonary:     Effort: Pulmonary effort is normal. No respiratory distress.     Breath sounds: Normal breath sounds.  Abdominal:     General: Bowel sounds are normal.     Tenderness: There is no abdominal tenderness. There is no guarding or rebound.     Hernia: There is no hernia in the left inguinal area or right inguinal area.  Genitourinary:    Penis: Circumcised. No hypospadias, erythema, tenderness,  discharge, swelling or lesions.      Testes:        Right: Mass, tenderness or swelling not present. Right testis is descended.        Left: Mass, tenderness or swelling not present. Left testis is descended.     Epididymis:     Right: Not inflamed or enlarged.     Left: Not inflamed or enlarged.  Musculoskeletal:     Cervical back: No rigidity or tenderness.  Lymphadenopathy:     Lower Body: No right inguinal adenopathy. No left inguinal adenopathy.  Skin:    General: Skin is warm and dry.  Neurological:     Mental Status: He is alert and oriented to person, place, and time.  Psychiatric:        Mood and Affect: Mood normal.        Behavior: Behavior normal.      No results found for any visits on 06/26/23.    The ASCVD Risk score (Arnett DK, et al., 2019) failed to calculate for the following reasons:   The 2019 ASCVD risk score is only valid for ages 23 to 70    Assessment & Plan:   Healthcare maintenance -     CBC with Differential/Platelet -     Urinalysis, Routine w reflex microscopic  Screening for cholesterol level -     Comprehensive metabolic panel -     Lipid panel  Screening for diabetes mellitus -     Comprehensive metabolic panel -     Hemoglobin A1c  Palpitation -     TSH  Libido, decreased -     Testosterone  Total,Free,Bio, Males  Screening for HIV (human immunodeficiency virus) -     HIV Antibody (routine testing w rflx)  Encounter for hepatitis C screening test for low risk patient -     Hepatitis C antibody    Return in about 1 year (around 06/25/2024), or if symptoms worsen or fail to improve, for annual physical.  Will follow palpitations for now.  He will let me know if they become more predictable and/or symptomatic with chest pain or shortness of breath.  Encouraged that he continue regular exercise.  Information was given about health maintenance and disease prevention.  Screening for HIV and hep C.  Elsie Sim Lent, MD

## 2023-06-27 ENCOUNTER — Telehealth: Payer: Self-pay | Admitting: Family Medicine

## 2023-06-27 ENCOUNTER — Telehealth: Payer: Self-pay

## 2023-06-27 LAB — TESTOSTERONE TOTAL,FREE,BIO, MALES
Albumin: 4.4 g/dL (ref 3.6–5.1)
Sex Hormone Binding: 38 nmol/L (ref 10–50)
Testosterone, Bioavailable: 135.4 ng/dL (ref 110.0–575.0)
Testosterone, Free: 67.3 pg/mL (ref 46.0–224.0)
Testosterone: 550 ng/dL (ref 250–827)

## 2023-06-27 LAB — HEPATITIS C ANTIBODY: Hepatitis C Ab: NONREACTIVE

## 2023-06-27 LAB — HIV ANTIBODY (ROUTINE TESTING W REFLEX): HIV 1&2 Ab, 4th Generation: NONREACTIVE

## 2023-06-27 NOTE — Telephone Encounter (Signed)
 Patient informed PCP has not reviewed his lab results and someone will call him once lab results have been reviewed.   Copied from CRM [id number]. Topic: Clinical - Medical Advice >> Jun 27, 2023  9:57 AM Derrick Dixon wrote: Reason for CRM: Patient had labs taken yesterday for Dr.Kremer and has a few questions regarding labs . Patent would like a call a call back .

## 2023-06-27 NOTE — Telephone Encounter (Unsigned)
 Copied from CRM [id number]. Topic: Clinical - Medical Advice >> Jun 27, 2023  9:57 AM Leavy Cella D wrote: Reason for CRM: Patient had labs taken yesterday for Dr.Kremer and has a few questions regarding labs . Patent would like a call a call back .

## 2023-07-09 ENCOUNTER — Encounter: Payer: Self-pay | Admitting: Family Medicine

## 2023-07-09 DIAGNOSIS — R6882 Decreased libido: Secondary | ICD-10-CM

## 2023-07-16 ENCOUNTER — Telehealth: Payer: Self-pay

## 2023-07-16 NOTE — Telephone Encounter (Signed)
Copied from CRM [id number]. Topic: Clinical - Medication Question >> Jul 15, 2023  5:02 PM Alvino Blood C wrote: Reason for CRM: Patient states he needs the following vaccines:  Global pollo vaccine  Hepatitis A  Menegitist  Yellow fever  And he will need them all to be administered before the end of the week for travel plans

## 2023-07-17 ENCOUNTER — Ambulatory Visit (INDEPENDENT_AMBULATORY_CARE_PROVIDER_SITE_OTHER): Payer: No Typology Code available for payment source

## 2023-07-17 DIAGNOSIS — Z23 Encounter for immunization: Secondary | ICD-10-CM

## 2023-07-17 NOTE — Progress Notes (Signed)
Per orders of Dr Doreene Burke, injection of Hep A and Meningococcal given by Mickle Plumb, cma.  Patient tolerated injection well.  He will return in 8 week for a second Hep A and 6 months for the second Meningococcal.  Dm/cma

## 2023-07-17 NOTE — Telephone Encounter (Signed)
Copied from CRM [id number]. Topic: Clinical - Medication Question >> Jul 17, 2023  4:14 PM Taleah C wrote: Reason for CRM: pt called and stated that he was seen today for vaccinations. He stated that he forgot to ask the clinic if his pcp could send him a prescription for doxycycline prophylaxis because he will be traveling to [place] tomorrow and the risks of Malaria are very high in that area. Please call and advise with patient.

## 2023-12-02 ENCOUNTER — Encounter: Payer: Self-pay | Admitting: Family Medicine

## 2023-12-02 ENCOUNTER — Ambulatory Visit: Admitting: Family Medicine

## 2023-12-02 ENCOUNTER — Ambulatory Visit: Payer: Self-pay

## 2023-12-02 VITALS — BP 118/76 | HR 70 | Temp 97.2°F | Ht 75.0 in | Wt 173.2 lb

## 2023-12-02 DIAGNOSIS — S39011A Strain of muscle, fascia and tendon of abdomen, initial encounter: Secondary | ICD-10-CM
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
